# Patient Record
Sex: Female | Born: 1945 | Race: White | Hispanic: No | Marital: Married | Smoking: Never smoker
Health system: Southern US, Community
[De-identification: ages and names within clinical notes are randomized; demographics above are authoritative.]

## PROBLEM LIST (undated history)

## (undated) DIAGNOSIS — K222 Esophageal obstruction: Secondary | ICD-10-CM

## (undated) DIAGNOSIS — I471 Supraventricular tachycardia, unspecified: Secondary | ICD-10-CM

## (undated) DIAGNOSIS — Z8619 Personal history of other infectious and parasitic diseases: Secondary | ICD-10-CM

## (undated) DIAGNOSIS — I447 Left bundle-branch block, unspecified: Secondary | ICD-10-CM

## (undated) DIAGNOSIS — G43909 Migraine, unspecified, not intractable, without status migrainosus: Secondary | ICD-10-CM

## (undated) DIAGNOSIS — R000 Tachycardia, unspecified: Secondary | ICD-10-CM

## (undated) DIAGNOSIS — E785 Hyperlipidemia, unspecified: Secondary | ICD-10-CM

## (undated) HISTORY — DX: Esophageal obstruction: K22.2

## (undated) HISTORY — DX: Supraventricular tachycardia, unspecified: I47.10

## (undated) HISTORY — PX: MASTECTOMY: SHX3

## (undated) HISTORY — DX: Tachycardia, unspecified: R00.0

## (undated) HISTORY — DX: Left bundle-branch block, unspecified: I44.7

## (undated) HISTORY — DX: Hyperlipidemia, unspecified: E78.5

## (undated) HISTORY — DX: Migraine, unspecified, not intractable, without status migrainosus: G43.909

## (undated) HISTORY — DX: Supraventricular tachycardia: I47.1

## (undated) HISTORY — DX: Personal history of other infectious and parasitic diseases: Z86.19

---

## 1983-03-20 HISTORY — PX: TONSILLECTOMY: SUR1361

## 1991-03-30 HISTORY — PX: HYSTERECTOMY ABDOMINAL WITH SALPINGECTOMY: SHX6725

## 1997-03-19 HISTORY — PX: LUMBAR DISC SURGERY: SHX700

## 2012-08-05 LAB — HM PAP SMEAR: HM Pap smear: NEGATIVE

## 2013-03-19 HISTORY — PX: PLACEMENT OF BREAST IMPLANTS: SHX6334

## 2016-06-22 ENCOUNTER — Telehealth: Payer: Self-pay | Admitting: *Deleted

## 2016-06-22 NOTE — Telephone Encounter (Signed)
NOTES FAXED TO NL °

## 2016-06-26 ENCOUNTER — Telehealth: Payer: Self-pay | Admitting: Cardiovascular Disease

## 2016-06-26 NOTE — Telephone Encounter (Signed)
Received records from [facility] for appointment on 07/11/16 with Dr Oval Linsey.  Records put with Dr Blenda Mounts schedule for 07/11/16. lp

## 2016-07-11 ENCOUNTER — Ambulatory Visit (INDEPENDENT_AMBULATORY_CARE_PROVIDER_SITE_OTHER): Payer: Medicare Other | Admitting: Cardiovascular Disease

## 2016-07-11 ENCOUNTER — Encounter: Payer: Self-pay | Admitting: Cardiovascular Disease

## 2016-07-11 VITALS — BP 122/64 | HR 64 | Ht 66.5 in | Wt 120.0 lb

## 2016-07-11 DIAGNOSIS — I447 Left bundle-branch block, unspecified: Secondary | ICD-10-CM | POA: Diagnosis not present

## 2016-07-11 DIAGNOSIS — Z13220 Encounter for screening for lipoid disorders: Secondary | ICD-10-CM | POA: Diagnosis not present

## 2016-07-11 DIAGNOSIS — K222 Esophageal obstruction: Secondary | ICD-10-CM | POA: Diagnosis not present

## 2016-07-11 DIAGNOSIS — I471 Supraventricular tachycardia: Secondary | ICD-10-CM

## 2016-07-11 NOTE — Patient Instructions (Signed)
Medication Instructions:  Your physician recommends that you continue on your current medications as directed. Please refer to the Current Medication list given to you today.  Labwork: TSH/FT4/MAGNESIUM/CMET/CBC AT [facility]   Testing/Procedures: NONE  Follow-Up: Your physician wants you to follow-up in: 1 [place] will receive a reminder letter in the mail two months in advance. If you don't receive a letter, please call our office to schedule the follow-up appointment.  If you need a refill on your cardiac medications before your next appointment, please call your pharmacy.

## 2016-07-11 NOTE — Progress Notes (Signed)
Cardiology Office Note   Date:  07/15/2016   ID:  Kristin Hinton, DOB [birth year], MRN [id number]  PCP:  Pcp Not In System  Cardiologist:   Skeet Latch, MD   Chief Complaint  Patient presents with  . New Patient (Initial Visit)     History of Present Illness: Kristin Hinton is a 71 y.o. female with a rate related left bundle branch block, SVT, and Schatzki's right who presents to establish care. Kristin Hinton is transferring her medical care from [facility].  She was last seen 02/08/16.  She had and episode of lightheadedness and underwent a left heart catheterization 07/2015 that reportedly showed normal coronaries.  Her SVT was diagnosed in her 79s and has been managed with metoprolol.  Overall she has been doing well, but for the last few days she had a few episodes of palpitations.  Each episode lasts for less than one minute.  It typically occurs when she is laying in bed at night.  There is no associated shortness of breath and it goes away after coughing.  This felt different than her usual SVT episodes which typically leave her feeling very tired. Kristin Hinton denies lower extremity edema, orthopnea, or PND.  She likes to walk for exercise and uses the elliptical several times per week.  She also does yoga, Pilates, aand weights.  Sometimes on the elliptical she gets tired at first but it improves with time.  She denies chest pain or pressure.   Past Medical History:  Diagnosis Date  . LBBB (left bundle branch block)   . Schatzki's ring   . SVT (supraventricular tachycardia) ([place])   . Tachycardia     No past surgical history on file.   Current Outpatient Prescriptions  Medication Sig Dispense Refill  . dexlansoprazole (DEXILANT) 60 MG capsule Take 60 mg by mouth daily.    Marland Kitchen estradiol (CLIMARA - DOSED IN MG/24 HR) 0.075 mg/24hr patch Place 0.075 mg onto the skin once a week.    . fluticasone (FLONASE) 50 MCG/ACT nasal spray Place into both nostrils  daily.    . metoprolol succinate (TOPROL-XL) 25 MG 24 hr tablet Take 25 mg by mouth daily.    . ranitidine (ZANTAC) 75 MG tablet Take 75 mg by mouth 2 (two) times daily.     No current facility-administered medications for this visit.     Allergies:   Patient has no known allergies.    Social History:  The patient  reports that she has never smoked. She has quit using smokeless tobacco. She reports that she drinks alcohol.   Family History:  The patient's family history includes Breast cancer in her sister; Cancer in her brother and father; Congenital heart disease in her mother; Congestive Heart Failure in her mother; Stroke in her maternal grandmother.    ROS:  Please see the history of present illness.   Otherwise, review of systems are positive for none.   All other systems are reviewed and negative.    PHYSICAL EXAM: VS:  BP 122/64   Pulse 64   Ht 5' 6.5" (1.689 m)   Wt 54.4 kg (120 lb)   BMI 19.08 kg/m  , BMI Body mass index is 19.08 kg/m. GENERAL:  Well appearing HEENT:  Pupils equal round and reactive, fundi not visualized, oral mucosa unremarkable NECK:  No jugular venous distention, waveform within normal limits, carotid upstroke brisk and symmetric, no bruits, no thyromegaly LYMPHATICS:  No cervical adenopathy LUNGS:  Clear to auscultation  bilaterally HEART:  RRR.  PMI not displaced or sustained,S1 and S2 within normal limits, no S3, no S4, no clicks, no rubs, no murmurs ABD:  Flat, positive bowel sounds normal in frequency in pitch, no bruits, no rebound, no guarding, no midline pulsatile mass, no hepatomegaly, no splenomegaly EXT:  2 plus pulses throughout, no edema, no cyanosis no clubbing SKIN:  No rashes no nodules NEURO:  Cranial nerves II through XII grossly intact, motor grossly intact throughout PSYCH:  Cognitively intact, oriented to person place and time   EKG:  EKG is ordered today. The ekg ordered 07/11/16 demonstrates Sinus rhythm. Rate 64 bpm.  13  day Ziopatch 10/04/15:  Sinus rhythm. Rare PVCs and PACs. 113 runs of wide complex tachycardia suspicious for SVT with a rate related they aren't seen. Longest episode lasting X minutes 52 seconds at 130 bpm.  Echo 07/21/15: LVEF 55%. Mild MR/TR. RVSP 25  Cardiac CT-A2/9/17: Limited by artifact engaging Cardiac catheterization 08/01/15: Normal coronary arteries. LVEF 60%.  Recent Labs: 07/11/2016: ALT 16; BUN 14; Creat 0.77; Hemoglobin 12.6; Magnesium 2.1; Platelets 315; Potassium 5.4; Sodium 139; TSH 1.21   07/21/15: Sodium 138, potassium 4.6, BUN 12, creatinine 0.7 WBC 4.1, hemoglobin 13.0, hematocrit 38.5, platelets 28 Total cholesterol 212, trig lisuride 70, HDL 88, LDL 110   Lipid Panel    Component Value Date/Time   CHOL 208 (H) 07/11/2016 0815   TRIG 66 07/11/2016 0815   HDL 96 07/11/2016 0815   CHOLHDL 2.2 07/11/2016 0815   VLDL 13 07/11/2016 0815   LDLCALC 99 07/11/2016 0815      Wt Readings from Last 3 Encounters:  07/11/16 54.4 kg (120 lb)      ASSESSMENT AND PLAN:  # SVT: Well-controlled.  Her episodes of palpitations are shot lived and don't seem like her typical SVT.  She was advise that she can take extra metoprolol as needed for palpitations.  We will check a CBC, TSH, free T4, magnesium and CMP.  # CV Disease Prevention: Kristin Hinton was congratulated on her excellent exercise regimen.  Check lipids.    Current medicines are reviewed at length with the patient today.  The patient does not have concerns regarding medicines.  The following changes have been made:  no change  Labs/ tests ordered today include:   Orders Placed This Encounter  Procedures  . CBC with Differential/Platelet  . T4, free  . TSH  . Magnesium  . Comprehensive metabolic panel  . Lipid panel  . Lipid panel  . EKG 12-Lead     Disposition:   FU with     This note was written with the assistance of speech recognition software.  Please excuse any transcriptional  errors.  Signed, Kaleiyah Polsky C. Oval Linsey, MD, [facility]  07/15/2016 10:00 PM    [place]

## 2016-07-12 DIAGNOSIS — I471 Supraventricular tachycardia: Secondary | ICD-10-CM | POA: Diagnosis not present

## 2016-07-12 DIAGNOSIS — Z13220 Encounter for screening for lipoid disorders: Secondary | ICD-10-CM | POA: Diagnosis not present

## 2016-07-12 DIAGNOSIS — Z79899 Other long term (current) drug therapy: Secondary | ICD-10-CM | POA: Diagnosis not present

## 2016-07-12 LAB — CBC WITH DIFFERENTIAL/PLATELET
BASOS ABS: 94 {cells}/uL (ref 0–200)
Basophils Relative: 2 %
EOS ABS: 47 {cells}/uL (ref 15–500)
EOS PCT: 1 %
HCT: 38.5 % (ref 35.0–45.0)
HEMOGLOBIN: 12.6 g/dL (ref 11.7–15.5)
LYMPHS ABS: 1081 {cells}/uL (ref 850–3900)
Lymphocytes Relative: 23 %
MCH: 30.9 pg (ref 27.0–33.0)
MCHC: 32.7 g/dL (ref 32.0–36.0)
MCV: 94.4 fL (ref 80.0–100.0)
MPV: 9.3 fL (ref 7.5–12.5)
Monocytes Absolute: 470 cells/uL (ref 200–950)
Monocytes Relative: 10 %
NEUTROS PCT: 64 %
Neutro Abs: 3008 cells/uL (ref 1500–7800)
Platelets: 315 10*3/uL (ref 140–400)
RBC: 4.08 MIL/uL (ref 3.80–5.10)
RDW: 13.9 % (ref 11.0–15.0)
WBC: 4.7 10*3/uL (ref 3.8–10.8)

## 2016-07-12 LAB — MAGNESIUM: Magnesium: 2.1 mg/dL (ref 1.5–2.5)

## 2016-07-12 LAB — COMPREHENSIVE METABOLIC PANEL
ALK PHOS: 46 U/L (ref 33–130)
ALT: 16 U/L (ref 6–29)
AST: 21 U/L (ref 10–35)
Albumin: 4.4 g/dL (ref 3.6–5.1)
BILIRUBIN TOTAL: 0.4 mg/dL (ref 0.2–1.2)
BUN: 14 mg/dL (ref 7–25)
CALCIUM: 9.1 mg/dL (ref 8.6–10.4)
CO2: 25 mmol/L (ref 20–31)
CREATININE: 0.77 mg/dL (ref 0.60–0.93)
Chloride: 104 mmol/L (ref 98–110)
Glucose, Bld: 91 mg/dL (ref 65–99)
Potassium: 5.4 mmol/L — ABNORMAL HIGH (ref 3.5–5.3)
SODIUM: 139 mmol/L (ref 135–146)
Total Protein: 6.4 g/dL (ref 6.1–8.1)

## 2016-07-12 LAB — LIPID PANEL
CHOL/HDL RATIO: 2.2 ratio (ref <5.0)
Cholesterol: 208 mg/dL — ABNORMAL HIGH (ref <200)
HDL: 96 mg/dL (ref >50)
LDL Cholesterol: 99 mg/dL (ref <100)
Triglycerides: 66 mg/dL (ref <150)
VLDL: 13 mg/dL (ref <30)

## 2016-07-12 LAB — TSH: TSH: 1.21 mIU/L

## 2016-07-12 LAB — T4, FREE: Free T4: 1.2 ng/dL (ref 0.8–1.8)

## 2016-07-15 DIAGNOSIS — I471 Supraventricular tachycardia: Secondary | ICD-10-CM | POA: Insufficient documentation

## 2016-07-15 DIAGNOSIS — I447 Left bundle-branch block, unspecified: Secondary | ICD-10-CM | POA: Insufficient documentation

## 2016-07-15 DIAGNOSIS — K222 Esophageal obstruction: Secondary | ICD-10-CM | POA: Insufficient documentation

## 2016-07-25 ENCOUNTER — Telehealth: Payer: Self-pay | Admitting: Cardiovascular Disease

## 2016-07-25 NOTE — Telephone Encounter (Signed)
Spoke with the patient and gave her the lab results plus advice from Dr. Oval Linsey: Normal labs. Cholesterol is a little high. Work on limiting fried foods, fatty foods and cheese. Continue exercise. The patient verbalized her understanding.

## 2016-07-25 NOTE — Telephone Encounter (Signed)
Pt had labs done on 4.25.18 and want to know results. Please call pt

## 2016-07-30 DIAGNOSIS — L259 Unspecified contact dermatitis, unspecified cause: Secondary | ICD-10-CM | POA: Diagnosis not present

## 2016-10-29 DIAGNOSIS — R21 Rash and other nonspecific skin eruption: Secondary | ICD-10-CM | POA: Diagnosis not present

## 2016-10-29 DIAGNOSIS — Z9109 Other allergy status, other than to drugs and biological substances: Secondary | ICD-10-CM | POA: Diagnosis not present

## 2016-10-29 DIAGNOSIS — K219 Gastro-esophageal reflux disease without esophagitis: Secondary | ICD-10-CM | POA: Diagnosis not present

## 2016-10-29 DIAGNOSIS — Z9889 Other specified postprocedural states: Secondary | ICD-10-CM | POA: Diagnosis not present

## 2016-10-29 DIAGNOSIS — Z1231 Encounter for screening mammogram for malignant neoplasm of breast: Secondary | ICD-10-CM | POA: Diagnosis not present

## 2016-10-29 DIAGNOSIS — N951 Menopausal and female climacteric states: Secondary | ICD-10-CM | POA: Diagnosis not present

## 2016-10-30 ENCOUNTER — Other Ambulatory Visit: Payer: Self-pay

## 2016-10-30 MED ORDER — METOPROLOL SUCCINATE ER 25 MG PO TB24: 25.0000 mg | ORAL_TABLET | Freq: Every day | ORAL | 1 refills | Status: DC

## 2016-11-05 DIAGNOSIS — Z1231 Encounter for screening mammogram for malignant neoplasm of breast: Secondary | ICD-10-CM | POA: Diagnosis not present

## 2016-11-05 LAB — HM MAMMOGRAPHY

## 2016-11-06 DIAGNOSIS — L719 Rosacea, unspecified: Secondary | ICD-10-CM | POA: Diagnosis not present

## 2016-11-08 DIAGNOSIS — R922 Inconclusive mammogram: Secondary | ICD-10-CM | POA: Diagnosis not present

## 2016-11-29 DIAGNOSIS — L718 Other rosacea: Secondary | ICD-10-CM | POA: Diagnosis not present

## 2016-12-31 DIAGNOSIS — I8312 Varicose veins of left lower extremity with inflammation: Secondary | ICD-10-CM | POA: Diagnosis not present

## 2016-12-31 DIAGNOSIS — I8311 Varicose veins of right lower extremity with inflammation: Secondary | ICD-10-CM | POA: Diagnosis not present

## 2017-01-01 DIAGNOSIS — I781 Nevus, non-neoplastic: Secondary | ICD-10-CM | POA: Diagnosis not present

## 2017-01-11 DIAGNOSIS — I8312 Varicose veins of left lower extremity with inflammation: Secondary | ICD-10-CM | POA: Diagnosis not present

## 2017-01-11 DIAGNOSIS — I8311 Varicose veins of right lower extremity with inflammation: Secondary | ICD-10-CM | POA: Diagnosis not present

## 2017-01-15 DIAGNOSIS — I83813 Varicose veins of bilateral lower extremities with pain: Secondary | ICD-10-CM | POA: Diagnosis not present

## 2017-04-02 ENCOUNTER — Telehealth: Payer: Self-pay | Admitting: Cardiovascular Disease

## 2017-04-02 NOTE — Telephone Encounter (Signed)
spoke to patient - per protocol patient does not need  Pre -med - for diagnosis of SVT Patient is having a crown placed tomorrow 1 pm. Patient aware will defer to Dr Oval Linsey ans will contact her back

## 2017-04-02 NOTE — Telephone Encounter (Signed)
Pt has SVT,she has a dentist appointment tomrrow.Does she need to be pre-medicated?

## 2017-04-02 NOTE — Telephone Encounter (Signed)
Discussed with Dr Oval Linsey and patient does not need SBE, advised patient

## 2017-04-30 DIAGNOSIS — N951 Menopausal and female climacteric states: Secondary | ICD-10-CM | POA: Diagnosis not present

## 2017-04-30 DIAGNOSIS — Z0000 Encounter for general adult medical examination without abnormal findings: Secondary | ICD-10-CM | POA: Diagnosis not present

## 2017-04-30 DIAGNOSIS — I700 Atherosclerosis of aorta: Secondary | ICD-10-CM | POA: Diagnosis not present

## 2017-04-30 LAB — LIPID PANEL
Cholesterol: 228 — AB (ref 0–200)
HDL: 93 — AB (ref 35–70)
LDL CALC: 120
LDL/HDL RATIO: 2.5
Triglycerides: 76 (ref 40–160)

## 2017-04-30 LAB — VITAMIN D 25 HYDROXY (VIT D DEFICIENCY, FRACTURES): Vit D, 25-Hydroxy: 30

## 2017-04-30 LAB — CBC AND DIFFERENTIAL
HEMATOCRIT: 37 (ref 36–46)
HEMOGLOBIN: 12.9 (ref 12.0–16.0)
Neutrophils Absolute: 4
Platelets: 320 (ref 150–399)
WBC: 6.5

## 2017-04-30 LAB — BASIC METABOLIC PANEL
BUN: 12 (ref 4–21)
CREATININE: 0.8 (ref 0.5–1.1)
Glucose: 100
Potassium: 4.7 (ref 3.4–5.3)
SODIUM: 136 — AB (ref 137–147)

## 2017-04-30 LAB — HEPATIC FUNCTION PANEL
ALK PHOS: 48 (ref 25–125)
ALT: 19 (ref 7–35)
AST: 28 (ref 13–35)
Bilirubin, Total: 0.6

## 2017-06-19 DIAGNOSIS — H2513 Age-related nuclear cataract, bilateral: Secondary | ICD-10-CM | POA: Diagnosis not present

## 2017-06-27 DIAGNOSIS — Z800 Family history of malignant neoplasm of digestive organs: Secondary | ICD-10-CM | POA: Diagnosis not present

## 2017-06-27 DIAGNOSIS — Z809 Family history of malignant neoplasm, unspecified: Secondary | ICD-10-CM | POA: Diagnosis not present

## 2017-07-14 NOTE — Progress Notes (Signed)
Cardiology Office Note   Date:  07/15/2017   ID:  Kristin Hinton, DOB [birth year], MRN [id number]  PCP:  Kristin Passy, MD  Cardiologist:   Kristin Latch, MD   No chief complaint on file.    History of Present Illness: Kristin Hinton is a 72 y.o. female with a rate related left bundle branch block, SVT, and Schatzki's ring here for follow up.  She was initially seen 06/2016 to establish care after being seen by [facility].  She had and episode of lightheadedness and underwent a left heart catheterization 07/2015 that reportedly showed normal coronaries.  Her SVT was diagnosed in her 27s and has been managed with metoprolol.  Since her last appointment she had been doing well.  However lately she notes more frequent palpitations.  It occurs later in the evening she takes an extra dose of metoprolol.  She tries not to take more doses in the mornings as it makes her feel fatigued.  The episodes are occurring multiple times per week and last up to a minute or 2 at a time.  It typically improves with cough or extra metoprolol.  She continues to exercise regularly.  She goes to the gym daily and does elliptical, yoga, weights, or Pilates.  She has no exertional chest pain or shortness of breath.  She has not experienced any lower extremity edema, orthopnea, or PND.  She continues to work on her diet.    Past Medical History:  Diagnosis Date  . LBBB (left bundle branch block)   . Schatzki's ring   . SVT (supraventricular tachycardia) ([place])   . Tachycardia     History reviewed. No pertinent surgical history.   Current Outpatient Medications  Medication Sig Dispense Refill  . dexlansoprazole (DEXILANT) 60 MG capsule Take 60 mg by mouth daily.    Marland Kitchen estradiol (CLIMARA - DOSED IN MG/24 HR) 0.075 mg/24hr patch Place 0.075 mg onto the skin once a week.    . fluticasone (FLONASE) 50 MCG/ACT nasal spray Place into both nostrils daily.    . metoprolol succinate (TOPROL-XL) 25  MG 24 hr tablet Take 25 mg by mouth as directed. TAKE 2 TABLETS IN THE EVENING    . ranitidine (ZANTAC) 75 MG tablet Take 75 mg by mouth 2 (two) times daily.     No current facility-administered medications for this visit.     Allergies:   Molds & smuts and Pollen extract    Social History:  The patient  reports that she has never smoked. She has quit using smokeless tobacco. She reports that she drinks alcohol.   Family History:  The patient's family history includes Breast cancer in her sister; Cancer in her brother and father; Congenital heart disease in her mother; Congestive Heart Failure in her mother; Stroke in her maternal grandmother.    ROS:  Please see the history of present illness.   Otherwise, review of systems are positive for none.   All other systems are reviewed and negative.    PHYSICAL EXAM: VS:  BP 131/68   Pulse 60   Ht 5\' 6"  (1.676 m)   Wt 117 lb 9.6 oz (53.3 kg)   BMI 18.98 kg/m  , BMI Body mass index is 18.98 kg/m. GENERAL:  Well appearing HEENT:  Pupils equal round and reactive, fundi not visualized, oral mucosa unremarkable NECK:  No jugular venous distention, waveform within normal limits, carotid upstroke brisk and symmetric, no bruits, no thyromegaly LYMPHATICS:  No cervical adenopathy  LUNGS:  Clear to auscultation bilaterally HEART:  RRR.  PMI not displaced or sustained,S1 and S2 within normal limits, no S3, no S4, no clicks, no rubs, no murmurs ABD:  Flat, positive bowel sounds normal in frequency in pitch, no bruits, no rebound, no guarding, no midline pulsatile mass, no hepatomegaly, no splenomegaly EXT:  2 plus pulses throughout, no edema, no cyanosis no clubbing SKIN:  No rashes no nodules NEURO:  Cranial nerves II through XII grossly intact, motor grossly intact throughout PSYCH:  Cognitively intact, oriented to person place and time   EKG:  EKG is ordered today. The ekg ordered 07/11/16 demonstrates Sinus rhythm. Rate 64 bpm. 07/15/17: Sinus  rhythm.   Rate 60 pm.  Non-specific ST changes.  She was  13 day Ziopatch 10/04/15:  Sinus rhythm. Rare PVCs and PACs. 113 runs of wide complex tachycardia suspicious for SVT with a rate related they aren't seen. Longest episode lasting X minutes 52 seconds at 130 bpm.  Echo 07/21/15: LVEF 55%. Mild MR/TR. RVSP 25  Cardiac CT-A2/9/17: Limited by artifact engaging Cardiac catheterization 08/01/15: Normal coronary arteries. LVEF 60%.  Recent Labs: No results found for requested labs within last 8760 hours.   07/21/15: Sodium 138, potassium 4.6, BUN 12, creatinine 0.7 WBC 4.1, hemoglobin 13.0, hematocrit 38.5, platelets 28 Total cholesterol 212, trig lisuride 70, HDL 88, LDL 110   Lipid Panel    Component Value Date/Time   CHOL 208 (H) 07/11/2016 0815   TRIG 66 07/11/2016 0815   HDL 96 07/11/2016 0815   CHOLHDL 2.2 07/11/2016 0815   VLDL 13 07/11/2016 0815   LDLCALC 99 07/11/2016 0815      Wt Readings from Last 3 Encounters:  07/15/17 117 lb 9.6 oz (53.3 kg)  07/11/16 120 lb (54.4 kg)      ASSESSMENT AND PLAN:  # SVT: More frequent episodes.  Increase metoprolol to 50mg  qhs.  Check CMP, CBC, magnesium, and TSH.   # CV Disease Prevention: [name] was congratulated on her excellent exercise regimen.  Check lipids and CMP.     Current medicines are reviewed at length with the patient today.  The patient does not have concerns regarding medicines.  The following changes have been made:  Increase metoprolol to 50mg   Labs/ tests ordered today include:   Orders Placed This Encounter  Procedures  . Lipid panel  . TSH  . EKG 12-Lead     Disposition:   FU with Kristin Shortridge C. Oval Linsey, MD, [facility] in 1-2 months.    Signed, Kristin Makris C. Oval Linsey, MD, [facility]  07/15/2017 11:27 AM    [place]

## 2017-07-15 ENCOUNTER — Encounter: Payer: Self-pay | Admitting: Cardiovascular Disease

## 2017-07-15 ENCOUNTER — Ambulatory Visit (INDEPENDENT_AMBULATORY_CARE_PROVIDER_SITE_OTHER): Payer: Medicare Other | Admitting: Cardiovascular Disease

## 2017-07-15 VITALS — BP 131/68 | HR 60 | Ht 66.0 in | Wt 117.6 lb

## 2017-07-15 DIAGNOSIS — I471 Supraventricular tachycardia: Secondary | ICD-10-CM

## 2017-07-15 NOTE — Patient Instructions (Addendum)
Medication Instructions:  INCREASE YOUR TO METOPROLOL 50 MG IN THE EVENING  Labwork: TSH/MAGNESIUM/CBC/CMET TODAY   Testing/Procedures: NONE  Follow-Up: Your physician recommends that you schedule a follow-up appointment in: 1-2 MONTHS   If you need a refill on your cardiac medications before your next appointment, please call your pharmacy.

## 2017-07-16 ENCOUNTER — Ambulatory Visit (INDEPENDENT_AMBULATORY_CARE_PROVIDER_SITE_OTHER): Payer: Medicare Other | Admitting: Family Medicine

## 2017-07-16 ENCOUNTER — Encounter: Payer: Self-pay | Admitting: Family Medicine

## 2017-07-16 VITALS — BP 116/60 | HR 60 | Temp 97.8°F | Ht 67.0 in | Wt 117.8 lb

## 2017-07-16 DIAGNOSIS — I447 Left bundle-branch block, unspecified: Secondary | ICD-10-CM

## 2017-07-16 DIAGNOSIS — K222 Esophageal obstruction: Secondary | ICD-10-CM

## 2017-07-16 DIAGNOSIS — I471 Supraventricular tachycardia: Secondary | ICD-10-CM | POA: Diagnosis not present

## 2017-07-16 DIAGNOSIS — M62838 Other muscle spasm: Secondary | ICD-10-CM | POA: Diagnosis not present

## 2017-07-16 LAB — CBC WITH DIFFERENTIAL/PLATELET
BASOS ABS: 0.1 10*3/uL (ref 0.0–0.2)
BASOS: 1 %
EOS (ABSOLUTE): 0.1 10*3/uL (ref 0.0–0.4)
Eos: 1 %
HEMOGLOBIN: 12.5 g/dL (ref 11.1–15.9)
Hematocrit: 38.1 % (ref 34.0–46.6)
IMMATURE GRANS (ABS): 0 10*3/uL (ref 0.0–0.1)
IMMATURE GRANULOCYTES: 0 %
LYMPHS: 27 %
Lymphocytes Absolute: 1.3 10*3/uL (ref 0.7–3.1)
MCH: 31.2 pg (ref 26.6–33.0)
MCHC: 32.8 g/dL (ref 31.5–35.7)
MCV: 95 fL (ref 79–97)
MONOCYTES: 11 %
Monocytes Absolute: 0.5 10*3/uL (ref 0.1–0.9)
NEUTROS ABS: 2.8 10*3/uL (ref 1.4–7.0)
NEUTROS PCT: 60 %
PLATELETS: 311 10*3/uL (ref 150–379)
RBC: 4.01 x10E6/uL (ref 3.77–5.28)
RDW: 13.9 % (ref 12.3–15.4)
WBC: 4.7 10*3/uL (ref 3.4–10.8)

## 2017-07-16 LAB — COMPREHENSIVE METABOLIC PANEL
ALT: 19 IU/L (ref 0–32)
AST: 26 IU/L (ref 0–40)
Albumin/Globulin Ratio: 2 (ref 1.2–2.2)
Albumin: 4.5 g/dL (ref 3.5–4.8)
Alkaline Phosphatase: 54 IU/L (ref 39–117)
BUN/Creatinine Ratio: 8 — ABNORMAL LOW (ref 12–28)
BUN: 7 mg/dL — AB (ref 8–27)
Bilirubin Total: 0.3 mg/dL (ref 0.0–1.2)
CALCIUM: 9.5 mg/dL (ref 8.7–10.3)
CO2: 23 mmol/L (ref 20–29)
Chloride: 99 mmol/L (ref 96–106)
Creatinine, Ser: 0.84 mg/dL (ref 0.57–1.00)
GFR, EST AFRICAN AMERICAN: 81 mL/min/{1.73_m2} (ref >59)
GFR, EST NON AFRICAN AMERICAN: 70 mL/min/{1.73_m2} (ref >59)
Globulin, Total: 2.3 g/dL (ref 1.5–4.5)
Glucose: 78 mg/dL (ref 65–99)
Potassium: 4.9 mmol/L (ref 3.5–5.2)
Sodium: 138 mmol/L (ref 134–144)
TOTAL PROTEIN: 6.8 g/dL (ref 6.0–8.5)

## 2017-07-16 LAB — TSH: TSH: 0.845 u[IU]/mL (ref 0.450–4.500)

## 2017-07-16 LAB — MAGNESIUM: Magnesium: 2.2 mg/dL (ref 1.6–2.3)

## 2017-07-16 NOTE — Assessment & Plan Note (Signed)
Advised supportive care with heating pad, stretches. No red flag symptoms in history or on exam. Call or return to clinic prn if these symptoms worsen or fail to improve as anticipated. The patient indicates understanding of these issues and agrees with the plan.

## 2017-07-16 NOTE — Assessment & Plan Note (Signed)
Followed by cardiology. Beta blocker dosage recently increased. Asymptomatic currently.  No changes made.  She will keep follow up appointment with cardiology in June. The patient indicates understanding of these issues and agrees with the plan.

## 2017-07-16 NOTE — Progress Notes (Signed)
Subjective:   Patient ID: Kristin Hinton, female    DOB: 08/11/1945, 72 y.o.   MRN: [id number]  Kristin Hinton is a pleasant 72 y.o. year old female who presents to clinic today with New Patient (Initial Visit) (Patient is here today to establish care.  She had a Mammogram on 8.20.18.  She had a Colonoscopy a year ago in [place]  as well as an Endoscopy with esophageal stretching. States that she had BMD) and Neck Pain (She states that she has had a crick in her neck for about a week.  She has changed pillows, done stretches.  When she turns her head to the left she has pain in her left ear, left upper jaw, left temple pain as well.  She hurt so bad at 3am that she had to use her hand to lift her head and has been unable to sleep due to the pain.)  on 07/16/2017  HPI:  Neck pain- left sided neck stiffness and pain with movement that moves to her left ear and upper jaw intermittently for the past week. No left UE radiculopathy or weakness. Has tried changing pillows, stretching, yoga.  GERD- has been well controlled with Dexilant. H/o Schatzki's ring- s/p esophageal dilatation a couple of years ago.  Symptoms have been well controlled.  SVT- followed by cardiology, Dr. Oval Linsey.  Saw her yesterday.  Note reviewed- increased episodes so she has increased her metoprolol to 50 mg qhs. Also checked mag,lipid panel, CMET, and TSH- results have not returned yet.  Has follow up on June 17th.      Current Outpatient Medications on File Prior to Visit  Medication Sig Dispense Refill  . dexlansoprazole (DEXILANT) 60 MG capsule Take 60 mg by mouth daily.    Marland Kitchen estradiol (CLIMARA - DOSED IN MG/24 HR) 0.075 mg/24hr patch Place 0.075 mg onto the skin once a week.    . fluticasone (FLONASE) 50 MCG/ACT nasal spray Place into both nostrils daily.    . metoprolol succinate (TOPROL-XL) 25 MG 24 hr tablet Take 25 mg by mouth as directed. TAKE 2 TABLETS IN THE EVENING    . ranitidine (ZANTAC) 75 MG tablet Take 75  mg by mouth 2 (two) times daily.     No current facility-administered medications on file prior to visit.     Allergies  Allergen Reactions  . Molds & Smuts Itching  . Pollen Extract Itching    Past Medical History:  Diagnosis Date  . LBBB (left bundle branch block)   . Schatzki's ring   . SVT (supraventricular tachycardia) ([place])   . Tachycardia     No past surgical history on file.  Family History  Problem Relation Age of Onset  . Congenital heart disease Mother   . Congestive Heart Failure Mother   . Cancer Father   . Breast cancer Sister   . Cancer Brother   . Stroke Maternal Grandmother     Social History   Socioeconomic History  . Marital status: Married    Spouse name: Not on file  . Number of children: Not on file  . Years of education: Not on file  . Highest education level: Not on file  Occupational History  . Not on file  Social Needs  . Financial resource strain: Not on file  . Food insecurity:    Worry: Not on file    Inability: Not on file  . Transportation needs:    Medical: Not on file    Non-medical: Not on  file  Tobacco Use  . Smoking status: Never Smoker  . Smokeless tobacco: Former Network engineer and Sexual Activity  . Alcohol use: Yes    Comment: 7-10 drinks per week  . Drug use: Not on file  . Sexual activity: Not on file  Lifestyle  . Physical activity:    Days per week: Not on file    Minutes per session: Not on file  . Stress: Not on file  Relationships  . Social connections:    Talks on phone: Not on file    Gets together: Not on file    Attends religious service: Not on file    Active member of club or organization: Not on file    Attends meetings of clubs or organizations: Not on file    Relationship status: Not on file  . Intimate partner violence:    Fear of current or ex partner: Not on file    Emotionally abused: Not on file    Physically abused: Not on file    Forced sexual activity: Not on file  Other Topics  Concern  . Not on file  Social History Narrative  . Not on file   The PMH, PSH, Social History, Family History, Medications, and allergies have been reviewed in [facility], and have been updated if relevant.   Review of Systems  Constitutional: Negative.   HENT: Negative.   Eyes: Negative.   Respiratory: Negative.   Cardiovascular: Negative.   Gastrointestinal: Negative.   Endocrine: Negative.   Genitourinary: Negative.   Musculoskeletal: Positive for neck pain and neck stiffness. Negative for arthralgias, back pain, gait problem, joint swelling and myalgias.  Skin: Negative.   Allergic/Immunologic: Negative.   Neurological: Negative.   Hematological: Negative.   Psychiatric/Behavioral: Negative.   All other systems reviewed and are negative.      Objective:    BP 116/60 (BP Location: Left Arm, Patient Position: Sitting, Cuff Size: Normal)   Pulse 60   Temp 97.8 F (36.6 C) (Oral)   Ht 5\' 7"  (1.702 m)   Wt 117 lb 12.8 oz (53.4 kg)   SpO2 97%   BMI 18.45 kg/m    Physical Exam  Constitutional: She is oriented to person, place, and time. She appears well-developed and well-nourished. No distress.  HENT:  Head: Normocephalic and atraumatic.  Eyes: EOM are normal.  Cardiovascular: Normal rate.  Pulmonary/Chest: Effort normal.  Musculoskeletal:       Cervical back: She exhibits decreased range of motion and spasm. She exhibits no tenderness, no bony tenderness, no swelling, no edema, no deformity, no laceration, no pain and normal pulse.  Neurological: She is alert and oriented to person, place, and time. No cranial nerve deficit. Coordination normal.  Skin: Skin is warm and dry. She is not diaphoretic.  Psychiatric: She has a normal mood and affect. Her behavior is normal. Judgment and thought content normal.  Nursing note and vitals reviewed.         Assessment & Plan:   LBBB (left bundle branch block)  Schatzki's ring  SVT (supraventricular tachycardia)  (HCC) No follow-ups on file.

## 2017-07-16 NOTE — Patient Instructions (Signed)
Great to meet you.  Try a heating a pad on your neck and keep me updated.

## 2017-07-16 NOTE — Assessment & Plan Note (Signed)
Continue PPI. Remote h/o esophageal dilatation.

## 2017-07-17 ENCOUNTER — Encounter: Payer: Self-pay | Admitting: Family Medicine

## 2017-07-30 ENCOUNTER — Ambulatory Visit (INDEPENDENT_AMBULATORY_CARE_PROVIDER_SITE_OTHER): Payer: Medicare Other | Admitting: Family Medicine

## 2017-07-30 ENCOUNTER — Encounter: Payer: Self-pay | Admitting: Family Medicine

## 2017-07-30 ENCOUNTER — Encounter: Payer: Medicare Other | Admitting: Vascular Surgery

## 2017-07-30 ENCOUNTER — Encounter

## 2017-07-30 VITALS — BP 112/74 | HR 67 | Temp 97.8°F | Ht 67.0 in | Wt 116.6 lb

## 2017-07-30 DIAGNOSIS — M62830 Muscle spasm of back: Secondary | ICD-10-CM | POA: Diagnosis not present

## 2017-07-30 NOTE — Patient Instructions (Signed)
Great to see you.  Have a wonderful trip!  Keep using your heating pad and taking Alleve as needed with food.

## 2017-07-30 NOTE — Progress Notes (Signed)
SUBJECTIVE:  Kristin Hinton is a 72 y.o. female who complains of low back pain for 3 day(s), positional with bending or lifting, without radiation down the legs. Precipitating factors: none recalled by the patient. Prior history of back problems: previous spinal surgery - 2000. There is no numbness in the legs.  Has been using a heating pad and taking Alleve.  Current Outpatient Medications on File Prior to Visit  Medication Sig Dispense Refill  . dexlansoprazole (DEXILANT) 60 MG capsule Take 60 mg by mouth daily.    Marland Kitchen estradiol (CLIMARA - DOSED IN MG/24 HR) 0.075 mg/24hr patch Place 0.075 mg onto the skin once a week.    . fluticasone (FLONASE) 50 MCG/ACT nasal spray Place into both nostrils daily.    . metoprolol succinate (TOPROL-XL) 25 MG 24 hr tablet Take 25 mg by mouth as directed. TAKE 2 TABLETS IN THE EVENING    . ranitidine (ZANTAC) 75 MG tablet Take 75 mg by mouth 2 (two) times daily.     No current facility-administered medications on file prior to visit.     Allergies  Allergen Reactions  . Molds & Smuts Itching  . Pollen Extract Itching    Past Medical History:  Diagnosis Date  . History of chicken pox   . Hyperlipidemia   . LBBB (left bundle branch block)   . Migraines   . Schatzki's ring   . SVT (supraventricular tachycardia) ([place])   . Tachycardia     Past Surgical History:  Procedure Laterality Date  . HYSTERECTOMY ABDOMINAL WITH SALPINGECTOMY  03/30/1991  . [place] SURGERY  03/19/1997  . MASTECTOMY Left    Due to surgical complications related to previous breast implant  . PLACEMENT OF BREAST IMPLANTS  03/19/2013  . TONSILLECTOMY  03/20/1983    Family History  Problem Relation Age of Onset  . Congenital heart disease Mother   . Congestive Heart Failure Mother   . Hyperlipidemia Mother   . Hypertension Mother   . Kidney disease Mother   . Arthritis Mother   . Hearing loss Mother   . Cancer Father        Lung  . Breast cancer Sister   . Cancer  Brother   . Alcohol abuse Brother   . Drug abuse Brother   . Early death Brother   . Heart attack Brother   . Stroke Maternal Grandmother   . Arthritis Maternal Grandmother   . Hypercholesterolemia Maternal Grandmother   . Hypertension Maternal Grandmother   . Arthritis Paternal Grandmother   . Heart attack Paternal Grandmother   . Hypercholesterolemia Paternal Grandmother   . Hypertension Paternal Grandmother   . Alcohol abuse Brother   . Early death Brother   . Hypercholesterolemia Son   . Hypertension Son     Social History   Socioeconomic History  . Marital status: Married    Spouse name: Not on file  . Number of children: Not on file  . Years of education: Not on file  . Highest education level: Not on file  Occupational History  . Not on file  Social Needs  . Financial resource strain: Not on file  . Food insecurity:    Worry: Not on file    Inability: Not on file  . Transportation needs:    Medical: Not on file    Non-medical: Not on file  Tobacco Use  . Smoking status: Never Smoker  . Smokeless tobacco: Former Network engineer and Sexual Activity  . Alcohol use:  Yes    Comment: 7-10 drinks per week  . Drug use: Not on file  . Sexual activity: Not on file  Lifestyle  . Physical activity:    Days per week: Not on file    Minutes per session: Not on file  . Stress: Not on file  Relationships  . Social connections:    Talks on phone: Not on file    Gets together: Not on file    Attends religious service: Not on file    Active member of club or organization: Not on file    Attends meetings of clubs or organizations: Not on file    Relationship status: Not on file  . Intimate partner violence:    Fear of current or ex partner: Not on file    Emotionally abused: Not on file    Physically abused: Not on file    Forced sexual activity: Not on file  Other Topics Concern  . Not on file  Social History Narrative  . Not on file   The PMH, PSH, Social  History, Family History, Medications, and allergies have been reviewed in [facility], and have been updated if relevant.  OBJECTIVE: BP 112/74 (BP Location: Left Arm, Patient Position: Sitting, Cuff Size: Normal)   Pulse 67   Temp 97.8 F (36.6 C) (Oral)   Ht 5\' 7"  (1.702 m)   Wt 116 lb 9.6 oz (52.9 kg)   SpO2 99%   BMI 18.26 kg/m   Patient appears to be in mild to moderate pain, antalgic gait noted. Lumbosacral spine area reveals no local tenderness or mass.  Painful and reduced LS ROM noted. Straight leg raise is negative at 90 degrees on bilateral. DTR's, motor strength and sensation normal, including heel and toe gait.  Peripheral pulses are palpable. X-Ray: not indicated.  ASSESSMENT:  lumbar strain  PLAN: For acute pain, rest, intermittent application of heat (do not sleep on heating pad), analgesics and muscle relaxants are recommended. Discussed longer term treatment plan of prn NSAID's and discussed a home back care exercise program with flexion exercise routine. Proper lifting with avoidance of heavy lifting discussed. Consider Physical Therapy and XRay studies if not improving. Call or return to clinic prn if these symptoms worsen or fail to improve as anticipated.

## 2017-08-13 ENCOUNTER — Other Ambulatory Visit: Payer: Self-pay

## 2017-08-13 MED ORDER — ESTRADIOL 0.075 MG/24HR TD PTWK: 0.0750 mg | MEDICATED_PATCH | TRANSDERMAL | 3 refills | Status: DC

## 2017-08-13 MED ORDER — DEXLANSOPRAZOLE 60 MG PO CPDR: 60.0000 mg | DELAYED_RELEASE_CAPSULE | Freq: Every day | ORAL | 3 refills | Status: DC

## 2017-08-13 MED ORDER — FLUTICASONE PROPIONATE 50 MCG/ACT NA SUSP: 2.0000 | Freq: Every day | NASAL | 3 refills | Status: DC

## 2017-08-21 ENCOUNTER — Other Ambulatory Visit: Payer: Self-pay

## 2017-08-21 MED ORDER — ESTRADIOL 0.075 MG/24HR TD PTWK: 0.0750 mg | MEDICATED_PATCH | TRANSDERMAL | 3 refills | Status: DC

## 2017-08-21 NOTE — Progress Notes (Signed)
Pt takes patches biweekly/corrected and sent to [facility]/thx dmf

## 2017-09-02 ENCOUNTER — Encounter: Payer: Self-pay | Admitting: Cardiovascular Disease

## 2017-09-02 ENCOUNTER — Ambulatory Visit (INDEPENDENT_AMBULATORY_CARE_PROVIDER_SITE_OTHER): Payer: Medicare Other | Admitting: Cardiovascular Disease

## 2017-09-02 VITALS — BP 121/57 | HR 61 | Ht 66.0 in | Wt 113.8 lb

## 2017-09-02 DIAGNOSIS — I471 Supraventricular tachycardia: Secondary | ICD-10-CM

## 2017-09-02 MED ORDER — DILTIAZEM HCL ER COATED BEADS 120 MG PO CP24: 120.0000 mg | ORAL_CAPSULE | Freq: Every day | ORAL | 3 refills | Status: DC

## 2017-09-02 NOTE — Progress Notes (Signed)
Cardiology Office Note   Date:  09/02/2017   ID:  Kristin Hinton, DOB 09-Jan-1946, MRN [id number]  PCP:  Lucille Passy, MD  Cardiologist:   Skeet Latch, MD   Chief Complaint  Patient presents with  . Follow-up     History of Present Illness: Kristin Hinton is a 72 y.o. female with a rate related left bundle branch block, SVT, and Schatzki's ring here for follow up.  She was initially seen 06/2016 to establish care after being seen by [facility] Cardiology Specialist.  She had and episode of lightheadedness and underwent a left heart catheterization 07/2015 that reportedly showed normal coronaries.  Her SVT was diagnosed in her 43s and has been managed with metoprolol.  At her last appointment she noted increased palpitations.  Metoprolol was increased to 50mg  qhs.  Since that time she has not noted any improvement in her symptoms.  She continues to have palpitations multiple times each day.  Typically it lasts for a few seconds.  However at other times it lasts for longer periods of time and seems to improve with the cough.  She continues to be very active and exercises almost every day by lifting weights, doing Pilates, and doing yoga.  Since increasing the metoprolol she has been too tired to ride the elliptical.  She denies any lower extremity edema, orthopnea, or PND.   Past Medical History:  Diagnosis Date  . History of chicken pox   . Hyperlipidemia   . LBBB (left bundle branch block)   . Migraines   . Schatzki's ring   . SVT (supraventricular tachycardia) ([place])   . Tachycardia     Past Surgical History:  Procedure Laterality Date  . HYSTERECTOMY ABDOMINAL WITH SALPINGECTOMY  03/30/1991  . [place] SURGERY  03/19/1997  . MASTECTOMY Left    Due to surgical complications related to previous breast implant  . PLACEMENT OF BREAST IMPLANTS  03/19/2013  . TONSILLECTOMY  03/20/1983     Current Outpatient Medications  Medication Sig Dispense Refill  . dexlansoprazole  (DEXILANT) 60 MG capsule Take 1 capsule (60 mg total) by mouth daily. 90 capsule 3  . estradiol (CLIMARA - DOSED IN MG/24 HR) 0.075 mg/24hr patch Place 1 patch (0.075 mg total) onto the skin 2 (two) times a week. 24 patch 3  . fluticasone (FLONASE) 50 MCG/ACT nasal spray Place 2 sprays into both nostrils daily. 16 g 3  . ranitidine (ZANTAC) 75 MG tablet Take 75 mg by mouth 2 (two) times daily.    Marland Kitchen diltiazem (CARDIZEM CD) 120 MG 24 hr capsule Take 1 capsule (120 mg total) by mouth daily. 90 capsule 3   No current facility-administered medications for this visit.     Allergies:   Molds & smuts and Pollen extract    Social History:  The patient  reports that she has never smoked. She has quit using smokeless tobacco. She reports that she drinks alcohol.   Family History:  The patient's family history includes Alcohol abuse in her brother and brother; Arthritis in her maternal grandmother, mother, and paternal grandmother; Breast cancer in her sister; Cancer in her brother and father; Congenital heart disease in her mother; Congestive Heart Failure in her mother; Drug abuse in her brother; Early death in her brother and brother; Hearing loss in her mother; Heart attack in her brother and paternal grandmother; Hypercholesterolemia in her maternal grandmother, paternal grandmother, and son; Hyperlipidemia in her mother; Hypertension in her maternal grandmother, mother, paternal grandmother, and  son; Kidney disease in her mother; Stroke in her maternal grandmother.    ROS:  Please see the history of present illness.   Otherwise, review of systems are positive for none.   All other systems are reviewed and negative.    PHYSICAL EXAM: VS:  BP (!) 121/57   Pulse 61   Ht 5\' 6"  (1.676 m)   Wt 113 lb 12.8 oz (51.6 kg)   BMI 18.37 kg/m  , BMI Body mass index is 18.37 kg/m. GENERAL:  Well appearing HEENT: Pupils equal round and reactive, fundi not visualized, oral mucosa unremarkable NECK:  No  jugular venous distention, waveform within normal limits, carotid upstroke brisk and symmetric, no bruits, no thyromegaly LYMPHATICS:  No cervical adenopathy LUNGS:  Clear to auscultation bilaterally HEART:  RRR.  PMI not displaced or sustained,S1 and S2 within normal limits, no S3, no S4, no clicks, no rubs, no murmurs ABD:  Flat, positive bowel sounds normal in frequency in pitch, no bruits, no rebound, no guarding, no midline pulsatile mass, no hepatomegaly, no splenomegaly EXT:  2 plus pulses throughout, no edema, no cyanosis no clubbing SKIN:  No rashes no nodules NEURO:  Cranial nerves II through XII grossly intact, motor grossly intact throughout PSYCH:  Cognitively intact, oriented to person place and time  EKG:  EKG is not ordered today. The ekg ordered 07/11/16 demonstrates Sinus rhythm. Rate 64 bpm. 07/15/17: Sinus rhythm.   Rate 60 pm.  Non-specific ST changes.  She was  13 day [place]iop[place] 10/04/15:  Sinus rhythm. Rare PVCs and PACs. 113 runs of wide complex tachycardia suspicious for SVT with a rate related bundle. Longest episode lasting 6 minutes 52 seconds at 130 bpm.  Echo 07/21/15: LVEF 55%. Mild MR/TR. RVSP 25  Cardiac CT-A2/9/17: Limited by artifact engaging Cardiac catheterization 08/01/15: Normal coronary arteries. LVEF 60%.  Recent Labs: 07/15/2017: ALT 19; BUN 7; Creatinine, Ser 0.84; Hemoglobin 12.5; Magnesium 2.2; Platelets 311; Potassium 4.9; Sodium 138; TSH 0.845   07/21/15: Sodium 138, potassium 4.6, BUN 12, creatinine 0.7 WBC 4.1, hemoglobin 13.0, hematocrit 38.5, platelets 28 Total cholesterol 212, trig lisuride 70, HDL 88, LDL 110   Lipid Panel    Component Value Date/Time   CHOL 228 (A) 04/30/2017   TRIG 76 04/30/2017   HDL 93 (A) 04/30/2017   CHOLHDL 2.2 07/11/2016 0815   VLDL 13 07/11/2016 0815   LDLCALC 120 04/30/2017      Wt Readings from Last 3 Encounters:  09/02/17 113 lb 12.8 oz (51.6 kg)  07/30/17 116 lb 9.6 oz (52.9 kg)  07/16/17 117 lb  12.8 oz (53.4 kg)      ASSESSMENT AND PLAN:  # SVT: Labs unremarkable.  She continues to have symptoms despite increasing metoprolol and has fatigue that prohibits titration.  We will try diltiazem 120mg  daily.  Stop metoprolol.  We will have her seen by EP for consideration of antiarrhythmics or ablation.  She is not excited about ablation but would consider it.   # CV Disease Prevention: Ms. Pauli was congratulated on her excellent exercise regimen.  Check lipids and CMP.     Current medicines are reviewed at length with the patient today.  The patient does not have concerns regarding medicines.  The following changes have been made:  Stop metoprolol.  Start diltiazem.   Labs/ tests ordered today include:   Orders Placed This Encounter  Procedures  . Ambulatory referral to Cardiac Electrophysiology     Disposition:   FU with Randie Bloodgood C.  Oval Linsey, MD, [facility] in 3 months.     Signed, Kayce Chismar C. Oval Linsey, MD, [facility]  09/02/2017 12:15 PM    [facility]

## 2017-09-02 NOTE — Patient Instructions (Addendum)
Medication Instructions:  STOP METOPROLOL  START DILTIAZEM 120 MG DAILY   Labwork: NONE  Testing/Procedures: NONE  Follow-Up: Your physician recommends that you schedule a follow-up appointment in: DR [place] 3 MONTHS  You have been referred to ELECTROPHYSIOLOGY (EP) IN ABOUT 1 MONTH  IF YOU DO NOT HEAR FROM THE OFFICE YOU CALL [phone]

## 2017-09-10 ENCOUNTER — Encounter: Payer: Self-pay | Admitting: Cardiovascular Disease

## 2017-09-10 ENCOUNTER — Ambulatory Visit (INDEPENDENT_AMBULATORY_CARE_PROVIDER_SITE_OTHER): Payer: Medicare Other | Admitting: Vascular Surgery

## 2017-09-10 ENCOUNTER — Telehealth: Payer: Self-pay | Admitting: Cardiovascular Disease

## 2017-09-10 ENCOUNTER — Encounter: Payer: Self-pay | Admitting: Vascular Surgery

## 2017-09-10 VITALS — BP 101/60 | HR 79 | Temp 97.0°F | Resp 16 | Ht 66.5 in | Wt 113.0 lb

## 2017-09-10 DIAGNOSIS — I83893 Varicose veins of bilateral lower extremities with other complications: Secondary | ICD-10-CM | POA: Diagnosis not present

## 2017-09-10 NOTE — Telephone Encounter (Signed)
New message    Pt c/o medication issue:  1. Name of Medication: Diltiazem  2. How are you currently taking this medication (dosage and times per day)? 120 mg daily   3. Are you having a reaction (difficulty breathing--STAT)? Tired, lethargic, no appetite  4. What is your medication issue? Pt said she is having symptoms she was told to call with these symptoms. And it hasn't helped her heart palpitations that much.

## 2017-09-10 NOTE — Telephone Encounter (Signed)
Returned call to pt she states that she recently stopped metoprolol to dilt. She states that the dilt is not working and "does not work for her" she would like to go back to taking metoprolol this seems to work better as the [place] has not helped with her palp as they have increased since starting the [place]. She states that she is more lethargic and tired. She states that she is an active person and she does not want to get out of the bed and she does not trust herself to drive she denies any other sx no CP or pressure, SOB or any other sx. She does have an appt with Dr Lequita Halt [phone].  Can she stop the dilt and go back to metoprolol? Please advise

## 2017-09-10 NOTE — Progress Notes (Signed)
Vascular and Vein Specialist of [facility]  Patient name: Kristin Hinton MRN: [id number] DOB: Feb 18, 1946 Sex: female  REASON FOR CONSULT: Establish venous care  HPI: Kristin Hinton is a 72 y.o. female, who is here today with her husband.  She underwent treatment with ablation of her great and small saphenous vein bilaterally in [place].  She has now moved to this area and wishes to establish care with someone in our area.  She does not have any pain associated with any lower extremity pathology.  She is quite active with no other major medical difficulties.  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  She had seen in outlying vein center and wishes to establish her care here.  Past Medical History:  Diagnosis Date  . History of chicken pox   . Hyperlipidemia   . LBBB (left bundle branch block)   . Migraines   . Schatzki's ring   . SVT (supraventricular tachycardia) ([place])   . Tachycardia     Family History  Problem Relation Age of Onset  . Congenital heart disease Mother   . Congestive Heart Failure Mother   . Hyperlipidemia Mother   . Hypertension Mother   . Kidney disease Mother   . Arthritis Mother   . Hearing loss Mother   . Cancer Father        Lung  . Breast cancer Sister   . Cancer Brother   . Alcohol abuse Brother   . Drug abuse Brother   . Kristin Hinton death Brother   . Heart attack Brother   . Stroke Maternal Grandmother   . Arthritis Maternal Grandmother   . Hypercholesterolemia Maternal Grandmother   . Hypertension Maternal Grandmother   . Arthritis Paternal Grandmother   . Heart attack Paternal Grandmother   . Hypercholesterolemia Paternal Grandmother   . Hypertension Paternal Grandmother   . Alcohol abuse Brother   . Kristin Hinton death Brother   . Hypercholesterolemia Son   . Hypertension Son     SOCIAL HISTORY: Social History   Socioeconomic History    . Marital status: Married    Spouse name: Not on file  . Number of children: Not on file  . Years of education: Not on file  . Highest education level: Not on file  Occupational History  . Not on file  Social Needs  . Financial resource strain: Not on file  . Food insecurity:    Worry: Not on file    Inability: Not on file  . Transportation needs:    Medical: Not on file    Non-medical: Not on file  Tobacco Use  . Smoking status: Never Smoker  . Smokeless tobacco: Former Network engineer and Sexual Activity  . Alcohol use: Yes    Comment: 7-10 drinks per week  . Drug use: Not on file  . Sexual activity: Not on file  Lifestyle  . Physical activity:    Days per week: Not on file    Minutes per session: Not on file  . Stress: Not on file  Relationships  . Social connections:    Talks on phone: Not on file    Gets together: Not on file    Attends religious service: Not on file    Active member of club or organization: Not on file    Attends meetings of clubs or organizations: Not on file    Relationship status: Not on file  . Intimate partner violence:  Fear of current or ex partner: Not on file    Emotionally abused: Not on file    Physically abused: Not on file    Forced sexual activity: Not on file  Other Topics Concern  . Not on file  Social History Narrative  . Not on file    Allergies  Allergen Reactions  . Molds & Smuts Itching  . Pollen Extract Itching    Current Outpatient Medications  Medication Sig Dispense Refill  . dexlansoprazole (DEXILANT) 60 MG capsule Take 1 capsule (60 mg total) by mouth daily. 90 capsule 3  . diltiazem (CARDIZEM CD) 120 MG 24 hr capsule Take 1 capsule (120 mg total) by mouth daily. 90 capsule 3  . estradiol (CLIMARA - DOSED IN MG/24 HR) 0.075 mg/24hr patch Place 1 patch (0.075 mg total) onto the skin 2 (two) times a week. 24 patch 3  . fluticasone (FLONASE) 50 MCG/ACT nasal spray Place 2 sprays into both nostrils daily. 16 g  3  . ranitidine (ZANTAC) 75 MG tablet Take 75 mg by mouth 2 (two) times daily.     No current facility-administered medications for this visit.     REVIEW OF SYSTEMS:  '[X]'  denotes positive finding, '[ ]'  denotes negative finding Cardiac  Comments:  Chest pain or chest pressure:    Shortness of breath upon exertion:    Short of breath when lying flat:    Irregular heart rhythm: x       Vascular    Pain in calf, thigh, or hip brought on by ambulation:    Pain in feet at night that wakes you up from your sleep:     Blood clot in your veins:    Leg swelling:         Pulmonary    Oxygen at home:    Productive cough:     Wheezing:         Neurologic    Sudden weakness in arms or legs:     Sudden numbness in arms or legs:     Sudden onset of difficulty speaking or slurred speech:    Temporary loss of vision in one eye:     Problems with dizziness:         Gastrointestinal    Blood in stool:     Vomited blood:         Genitourinary    Burning when urinating:     Blood in urine:        Psychiatric    Major depression:         Hematologic    Bleeding problems:    Problems with blood clotting too easily:        Skin    Rashes or ulcers:        Constitutional    Fever or chills:      PHYSICAL EXAM: Vitals:   09/10/17 1128  BP: 101/60  Pulse: 79  Resp: 16  Temp: (!) 97 F (36.1 C)  SpO2: 97%  Weight: 113 lb (51.3 kg)  Height: 5' 6.5" (1.689 m)    GENERAL: The patient is a well-nourished female, in no acute distress. The vital signs are documented above. CARDIOVASCULAR: Palpable radial and dorsalis pedis pulses bilaterally PULMONARY: There is good air exchange  ABDOMEN: Soft and non-tender  MUSCULOSKELETAL: There are no major deformities or cyanosis. NEUROLOGIC: No focal weakness or paresthesias are detected. SKIN: There are no ulcers or rashes noted.  Gathered telangiectasia on both lower extremities pSYCHIATRIC:  The patient has a normal affect.  DATA:    Her venous duplex from [place] [facility].  This did show closure of her great and small saphenous veins.  Legs with SonoSite ultrasound and agree that there is no evidence of enlarged saphenous vein bilaterally  MEDICAL ISSUES: I reassured the patient that I do not see any evidence of venous pathology.  She is interested in sclerotherapy for telangiectasia for cosmetic concerns.  She met with Thea Silversmith RN to discuss timing of this.   Rosetta Posner, MD FACS [facility] Tel [phone] Pager [phone]

## 2017-09-11 NOTE — Telephone Encounter (Signed)
Thank you. Keep EP consult as scheduled.

## 2017-09-11 NOTE — Telephone Encounter (Signed)
Follow up    Patient is returning call to see if she can resume the metoprolol. Due to the side effects of the diltiazem.    Pt c/o medication issue:  1. Name of Medication: diltiazem  2. How are you currently taking this medication (dosage and times per day)? 120mg    3. Are you having a reaction (difficulty breathing--STAT)? Tired, lethargic, no appetite  4. What is your medication issue? Palps not improving.

## 2017-09-11 NOTE — Telephone Encounter (Signed)
Per verbal from MD, OK to stop diltiazem and resume metoprolol succinate at previous dose of 50mg 

## 2017-09-13 ENCOUNTER — Encounter: Payer: Self-pay | Admitting: Internal Medicine

## 2017-09-18 ENCOUNTER — Encounter: Payer: Self-pay | Admitting: Family Medicine

## 2017-09-20 ENCOUNTER — Other Ambulatory Visit: Payer: Self-pay

## 2017-09-20 MED ORDER — FLUTICASONE PROPIONATE 50 MCG/ACT NA SUSP
2.0000 | Freq: Every day | NASAL | 3 refills | Status: DC
Start: 2017-09-20 — End: 2018-10-17

## 2017-09-20 MED ORDER — FLUTICASONE PROPIONATE 50 MCG/ACT NA SUSP: 2.0000 | Freq: Every day | NASAL | 3 refills | Status: DC

## 2017-09-23 ENCOUNTER — Ambulatory Visit (INDEPENDENT_AMBULATORY_CARE_PROVIDER_SITE_OTHER): Payer: Medicare Other | Admitting: Internal Medicine

## 2017-09-23 ENCOUNTER — Encounter: Payer: Self-pay | Admitting: Internal Medicine

## 2017-09-23 VITALS — BP 106/68 | HR 63 | Ht 66.5 in | Wt 113.8 lb

## 2017-09-23 DIAGNOSIS — I471 Supraventricular tachycardia: Secondary | ICD-10-CM

## 2017-09-23 NOTE — Progress Notes (Signed)
ELECTROPHYSIOLOGY CONSULT NOTE  Patient ID: Kristin Hinton, MRN: [id number], DOB/AGE: 04/21/1945 72 y.o. Admit date: (Not on file) Date of Consult: 09/23/2017  Primary Physician: Lucille Passy, MD Primary Cardiologist: TR     Kristin Hinton is a 72 y.o. female who is being seen today for the evaluation of SVT at the request of TR.    HPI Kristin Hinton is a 72 y.o. female has a 50-year history of recurrent abrupt onset offset tachypalpitations which she is able to interrupt by coughing.  They were diagnosed at [facility] 2017 as SVT.  Cover sheet was reviewed and described SVT at 200 bpm with some aberration as well as SVT at 135 with possible atrial tachycardia.  She has been treated with beta-blockers and calcium blockers which she has not tolerated well.  She is relatively fit.  These episodes do not largely interfere with exercise.  They are not set associated with any specific activity.  Typically last seconds.  Event recorder describes the duration is up to 6 minutes.  They are associated with weakness fatigue and lightheadedness.  Echocardiogram at [facility] was read as normal Past Medical History:  Diagnosis Date  . History of chicken pox   . Hyperlipidemia   . LBBB (left bundle branch block)   . Migraines   . Schatzki's ring   . SVT (supraventricular tachycardia) ([place])   . Tachycardia       Surgical History:  Past Surgical History:  Procedure Laterality Date  . HYSTERECTOMY ABDOMINAL WITH SALPINGECTOMY  03/30/1991  . [place] SURGERY  03/19/1997  . MASTECTOMY Left    Due to surgical complications related to previous breast implant  . PLACEMENT OF BREAST IMPLANTS  03/19/2013  . TONSILLECTOMY  03/20/1983     Home Meds: Prior to Admission medications   Medication Sig Start Date End Date Taking? Authorizing Provider  dexlansoprazole (DEXILANT) 60 MG capsule Take 1 capsule (60 mg total) by mouth daily. 08/13/17  Yes Lucille Passy, MD    estradiol (CLIMARA - DOSED IN MG/24 HR) 0.075 mg/24hr patch Place 1 patch (0.075 mg total) onto the skin 2 (two) times a week. 08/22/17  Yes Lucille Passy, MD  fluticasone [facility]) 50 MCG/ACT nasal spray Place 2 sprays into both nostrils daily. 09/20/17  Yes Lucille Passy, MD  metoprolol succinate (TOPROL-XL) 25 MG 24 hr tablet Take 50 mg by mouth daily.   Yes [provider]  ranitidine (ZANTAC) 75 MG tablet Take 75 mg by mouth 2 (two) times daily.   Yes [provider]    Allergies:  Allergies  Allergen Reactions  . Molds & Smuts Itching  . Pollen Extract Itching    Social History   Socioeconomic History  . Marital status: Married    Spouse name: Not on file  . Number of children: Not on file  . Years of education: Not on file  . Highest education level: Not on file  Occupational History  . Not on file  Social Needs  . Financial resource strain: Not on file  . Food insecurity:    Worry: Not on file    Inability: Not on file  . Transportation needs:    Medical: Not on file    Non-medical: Not on file  Tobacco Use  . Smoking status: Never Smoker  . Smokeless tobacco: Former Network engineer and Sexual Activity  . Alcohol use: Yes    Comment: 7-10 drinks per week  . Drug  use: Never  . Sexual activity: Not on file  Lifestyle  . Physical activity:    Days per week: Not on file    Minutes per session: Not on file  . Stress: Not on file  Relationships  . Social connections:    Talks on phone: Not on file    Gets together: Not on file    Attends religious service: Not on file    Active member of club or organization: Not on file    Attends meetings of clubs or organizations: Not on file    Relationship status: Not on file  . Intimate partner violence:    Fear of current or ex partner: Not on file    Emotionally abused: Not on file    Physically abused: Not on file    Forced sexual activity: Not on file  Other Topics Concern  . Not on file  Social  History Narrative  . Not on file     Family History  Problem Relation Age of Onset  . Congenital heart disease Mother   . Congestive Heart Failure Mother   . Hyperlipidemia Mother   . Hypertension Mother   . Kidney disease Mother   . Arthritis Mother   . Hearing loss Mother   . Cancer Father        Lung  . Breast cancer Sister   . Cancer Brother   . Alcohol abuse Brother   . Drug abuse Brother   . Early death Brother   . Heart attack Brother   . Stroke Maternal Grandmother   . Arthritis Maternal Grandmother   . Hypercholesterolemia Maternal Grandmother   . Hypertension Maternal Grandmother   . Arthritis Paternal Grandmother   . Heart attack Paternal Grandmother   . Hypercholesterolemia Paternal Grandmother   . Hypertension Paternal Grandmother   . Alcohol abuse Brother   . Early death Brother   . Hypercholesterolemia Son   . Hypertension Son      ROS:  Please see the history of present illness.     All other systems reviewed and negative.    Physical Exam  Blood pressure 106/68, pulse 63, height 5' 6.5" (1.689 m), weight 113 lb 12.8 oz (51.6 kg), SpO2 98 %. General: Well developed, well nourished female in no acute distress. Head: Normocephalic, atraumatic, sclera non-icteric, no xanthomas, nares are without discharge. EENT: normal  Lymph Nodes:  none Neck: Negative for carotid bruits. JVD not elevated. Back:without scoliosis kyphosis  Lungs: Clear bilaterally to auscultation without wheezes, rales, or rhonchi. Breathing is unlabored. Heart: RRR with S1 S2. No   murmur . No rubs, or gallops appreciated. Abdomen: Soft, non-tender, non-distended with normoactive bowel sounds. No hepatomegaly. No rebound/guarding. No obvious abdominal masses. Msk:  Strength and tone appear normal for age. Extremities: No clubbing or cyanosis. No* edema.  Distal pedal pulses are 2+ and equal bilaterally. Skin: Warm and Dry Neuro: Alert and oriented X 3. CN III-XII intact Grossly normal  sensory and motor function . Psych:  Responds to questions appropriately with a normal affect.      Labs: Cardiac Enzymes No results for input(s): CKTOTAL, CKMB, TROPONINI in the last 72 hours. CBC Lab Results  Component Value Date   WBC 4.7 07/15/2017   HGB 12.5 07/15/2017   HCT 38.1 07/15/2017   MCV 95 07/15/2017   PLT 311 07/15/2017   PROTIME: No results for input(s): LABPROT, INR in the last 72 hours. Chemistry No results for input(s): NA, K, CL, CO2, BUN,  CREATININE, CALCIUM, PROT, BILITOT, ALKPHOS, ALT, AST, GLUCOSE in the last 168 hours.  Invalid input(s): LABALBU Lipids Lab Results  Component Value Date   CHOL 228 (A) 04/30/2017   HDL 93 (A) 04/30/2017   LDLCALC 120 04/30/2017   TRIG 76 04/30/2017   BNP No results found for: PROBNP Thyroid Function Tests: No results for input(s): TSH, T4TOTAL, T3FREE, THYROIDAB in the last 72 hours.  Invalid input(s): FREET3 Miscellaneous No results found for: DDIMER  Radiology/Studies:  No results found.  EKG: Sinsu without preexcitaztion   Assessment and Plan:  Tachycardia described as wide-complex with SVT and aberration as well as possible atrial tachycardia    The patient has frequent episodes of tachypalpitations occurring multiple times a day.  She is not particularly inclined towards ablation.  She has not tolerated beta-blockers or diltiazem well; however, she is accommodated to it over the last 50 years quite well.  Given the descriptions from the cover sheet of her last monitor, therapeutic options would be informed by more clearly understanding mechanisms.  We will use a ZIO Patch.    More than 50% of 85 min was spent in counseling related to the above     Virl Axe

## 2017-09-23 NOTE — Patient Instructions (Addendum)
Medication Instructions:  Your physician recommends that you continue on your current medications as directed. Please refer to the Current Medication list given to you today.  Labwork: None ordered.  Testing/Procedures: Your physician has suggested you have an event monitor placed.   Follow-Up: Your physician wants you to follow-up in: September, after you have had your monitor placed and evaluated.     Any Other Special Instructions Will Be Listed Below (If Applicable).     If you need a refill on your cardiac medications before your next appointment, please call your pharmacy.

## 2017-09-27 ENCOUNTER — Ambulatory Visit (INDEPENDENT_AMBULATORY_CARE_PROVIDER_SITE_OTHER): Payer: Medicare Other

## 2017-09-27 DIAGNOSIS — I471 Supraventricular tachycardia: Secondary | ICD-10-CM | POA: Diagnosis not present

## 2017-09-30 ENCOUNTER — Other Ambulatory Visit: Payer: Self-pay | Admitting: Cardiovascular Disease

## 2017-09-30 MED ORDER — METOPROLOL SUCCINATE ER 25 MG PO TB24: 50.0000 mg | ORAL_TABLET | Freq: Every day | ORAL | 3 refills | Status: DC

## 2017-09-30 MED ORDER — METOPROLOL SUCCINATE ER 25 MG PO TB24: 50.0000 mg | ORAL_TABLET | Freq: Every day | ORAL | 0 refills | Status: DC

## 2017-09-30 NOTE — Telephone Encounter (Signed)
New message    !!please also send 90 day supply to  [place], [place]  1. Which medications need to be refilled? (please list name of each medication and dose if known) metoprolol succinate (TOPROL-XL) 50  2. Which pharmacy/location (including street and city if local pharmacy) is medication to be sent to? [id number] - [place], [address] AT [address]  3. Do they need a 30 day or 90 day supply? [place]

## 2017-09-30 NOTE — Telephone Encounter (Signed)
Rx(s) sent to both pharmacies electronically.

## 2017-10-16 DIAGNOSIS — R002 Palpitations: Secondary | ICD-10-CM | POA: Diagnosis not present

## 2017-10-23 ENCOUNTER — Telehealth: Payer: Self-pay | Admitting: Internal Medicine

## 2017-10-23 NOTE — Telephone Encounter (Signed)
New message   Patient calling for monitor results 

## 2017-10-23 NOTE — Telephone Encounter (Signed)
Pt understands Dr Caryl Comes is out of the office. I will contact her back with results and recommendations when he returns. Pt understands we would have called her with any acute rhythms. Pt verbalized understanding and had no additional questions.

## 2017-11-08 ENCOUNTER — Telehealth: Payer: Self-pay | Admitting: Internal Medicine

## 2017-11-08 NOTE — Telephone Encounter (Signed)
New Message:   Patient calling about some lab results from her MY Chart.

## 2017-11-08 NOTE — Telephone Encounter (Signed)
Pt called to request her monitor results. I advised her that there was no SVT but we are still waiting to hear back from Dr. Caryl Comes.. She reports that she has been feeling well but still experiencing palpitations but without any other symptoms. She is still taking her metoprolol at night and says she has been doing well on it. She is following up with Dr. Caryl Comes 9/19 and I advised her that I will forward her message to Dr. Caryl Comes to see if he has any further recommendations for her based on her monitor results. Pt agreed and will call if she develops any other problems or symptoms.

## 2017-11-11 DIAGNOSIS — Z1231 Encounter for screening mammogram for malignant neoplasm of breast: Secondary | ICD-10-CM | POA: Diagnosis not present

## 2017-11-12 NOTE — Telephone Encounter (Signed)
Follow Up: ° ° ° °Returning your call from this morning. °

## 2017-11-12 NOTE — Telephone Encounter (Signed)
Reviewed monitor report with pt. At this time, Dr Caryl Comes has no additional recommendations except for her to follow up with him in September. Pt understands if her sx begin to worsen, she is to call the office for evaluation. Pt has verbalized understanding and had no additional questions.

## 2017-12-03 ENCOUNTER — Ambulatory Visit: Payer: Medicare Other | Admitting: Cardiovascular Disease

## 2017-12-05 ENCOUNTER — Encounter: Payer: Self-pay | Admitting: Internal Medicine

## 2017-12-05 ENCOUNTER — Ambulatory Visit (INDEPENDENT_AMBULATORY_CARE_PROVIDER_SITE_OTHER): Payer: Medicare Other | Admitting: Internal Medicine

## 2017-12-05 VITALS — BP 116/70 | HR 69 | Ht 66.5 in | Wt 116.0 lb

## 2017-12-05 DIAGNOSIS — I447 Left bundle-branch block, unspecified: Secondary | ICD-10-CM

## 2017-12-05 DIAGNOSIS — I471 Supraventricular tachycardia: Secondary | ICD-10-CM | POA: Diagnosis not present

## 2017-12-05 MED ORDER — PROPRANOLOL HCL ER 60 MG PO CP24: 60.0000 mg | ORAL_CAPSULE | Freq: Every day | ORAL | 0 refills | Status: DC

## 2017-12-05 MED ORDER — BISOPROLOL FUMARATE 5 MG PO TABS: 2.5000 mg | ORAL_TABLET | Freq: Every day | ORAL | 0 refills | Status: DC

## 2017-12-05 MED ORDER — NEBIVOLOL HCL 2.5 MG PO TABS: 2.5000 mg | ORAL_TABLET | Freq: Every day | ORAL | 0 refills | Status: DC

## 2017-12-05 NOTE — Progress Notes (Signed)
ELECTROPHYSIOLOGY OFFICE NOTE  Patient ID: Kristin Hinton, MRN: [id number], DOB/AGE: [birth year] 72 y.o. Admit date: (Not on file) Date of Consult: 12/05/2017  Primary Physician: Lucille Passy, MD Primary Cardiologist: TR     Kristin Hinton is a 72 y.o. female who is being seen today for the evaluation of SVT at the request of TR.    HPI Kristin Hinton is a 72 y.o. female has a 50-year history of recurrent abrupt onset offset tachypalpitations which she is able to interrupt by coughing.  They were diagnosed at [facility] 2017 as SVT.  Cover sheet was reviewed and described SVT at 200 bpm with some aberration as well as SVT at 135 with possible atrial tachycardia.  She has been treated with beta-blockers and calcium blockers which she has not tolerated well.  She is relatively fit.  These episodes do not largely interfere with exercise.  Review of monitor was reviewed with the patient.  She had occasional nonsustained atrial tachycardia, short runs of increased sinus rates, PACs and PVCs  Therapy with metoprolol tartrate was associate with significant fatigue;  Toprol succinate also has fatigue  While down in [place] she developed peripheral edema.  Resolved spontaneously  Echocardiogram at [facility] was read as normal Past Medical History:  Diagnosis Date  . History of chicken pox   . Hyperlipidemia   . LBBB (left bundle branch block)   . Migraines   . Schatzki's ring   . SVT (supraventricular tachycardia) ([place])   . Tachycardia       Surgical History:  Past Surgical History:  Procedure Laterality Date  . HYSTERECTOMY ABDOMINAL WITH SALPINGECTOMY  03/30/1991  . [place] SURGERY  03/19/1997  . MASTECTOMY Left    Due to surgical complications related to previous breast implant  . PLACEMENT OF BREAST IMPLANTS  03/19/2013  . TONSILLECTOMY  03/20/1983     Home Meds: Prior to Admission medications   Medication Sig Start Date End Date Taking?  Authorizing Provider  dexlansoprazole (DEXILANT) 60 MG capsule Take 1 capsule (60 mg total) by mouth daily. 08/13/17  Yes Lucille Passy, MD  estradiol (CLIMARA - DOSED IN MG/24 HR) 0.075 mg/24hr patch Place 1 patch (0.075 mg total) onto the skin 2 (two) times a week. 08/22/17  Yes Lucille Passy, MD  fluticasone [facility]) 50 MCG/ACT nasal spray Place 2 sprays into both nostrils daily. 09/20/17  Yes Lucille Passy, MD  metoprolol succinate (TOPROL-XL) 25 MG 24 hr tablet Take 50 mg by mouth daily.   Yes [provider]  ranitidine (ZANTAC) 75 MG tablet Take 75 mg by mouth 2 (two) times daily.   Yes [provider]    Allergies:  Allergies  Allergen Reactions  . Molds & Smuts Itching  . Pollen Extract Itching    Social History   Socioeconomic History  . Marital status: Married    Spouse name: Not on file  . Number of children: Not on file  . Years of education: Not on file  . Highest education level: Not on file  Occupational History  . Not on file  Social Needs  . Financial resource strain: Not on file  . Food insecurity:    Worry: Not on file    Inability: Not on file  . Transportation needs:    Medical: Not on file    Non-medical: Not on file  Tobacco Use  . Smoking status: Never Smoker  . Smokeless tobacco: Former Network engineer and  Sexual Activity  . Alcohol use: Yes    Comment: 7-10 drinks per week  . Drug use: Never  . Sexual activity: Not on file  Lifestyle  . Physical activity:    Days per week: Not on file    Minutes per session: Not on file  . Stress: Not on file  Relationships  . Social connections:    Talks on phone: Not on file    Gets together: Not on file    Attends religious service: Not on file    Active member of club or organization: Not on file    Attends meetings of clubs or organizations: Not on file    Relationship status: Not on file  . Intimate partner violence:    Fear of current or ex partner: Not on file    Emotionally  abused: Not on file    Physically abused: Not on file    Forced sexual activity: Not on file  Other Topics Concern  . Not on file  Social History Narrative  . Not on file     Family History  Problem Relation Age of Onset  . Congenital heart disease Mother   . Congestive Heart Failure Mother   . Hyperlipidemia Mother   . Hypertension Mother   . Kidney disease Mother   . Arthritis Mother   . Hearing loss Mother   . Cancer Father        Lung  . Breast cancer Sister   . Cancer Brother   . Alcohol abuse Brother   . Drug abuse Brother   . Early death Brother   . Heart attack Brother   . Stroke Maternal Grandmother   . Arthritis Maternal Grandmother   . Hypercholesterolemia Maternal Grandmother   . Hypertension Maternal Grandmother   . Arthritis Paternal Grandmother   . Heart attack Paternal Grandmother   . Hypercholesterolemia Paternal Grandmother   . Hypertension Paternal Grandmother   . Alcohol abuse Brother   . Early death Brother   . Hypercholesterolemia Son   . Hypertension Son      ROS:  Please see the history of present illness.     All other systems reviewed and negative.    Physical Exam  Blood pressure 116/70, pulse 69, height 5' 6.5" (1.689 m), weight 116 lb (52.6 kg), SpO2 98 %. Well developed and nourished in no acute distress HENT normal Neck supple with JVP-flat Clear Regular rate and rhythm, no murmurs or gallops Abd-soft with active BS No Clubbing cyanosis edema Skin-warm and dry A & Oriented  Grossly normal sensory and motor function      Labs: Cardiac Enzymes No results for input(s): CKTOTAL, CKMB, TROPONINI in the last 72 hours. CBC Lab Results  Component Value Date   WBC 4.7 07/15/2017   HGB 12.5 07/15/2017   HCT 38.1 07/15/2017   MCV 95 07/15/2017   PLT 311 07/15/2017   PROTIME: No results for input(s): LABPROT, INR in the last 72 hours. Chemistry No results for input(s): NA, K, CL, CO2, BUN, CREATININE, CALCIUM, PROT, BILITOT,  ALKPHOS, ALT, AST, GLUCOSE in the last 168 hours.  Invalid input(s): LABALBU Lipids Lab Results  Component Value Date   CHOL 228 (A) 04/30/2017   HDL 93 (A) 04/30/2017   LDLCALC 120 04/30/2017   TRIG 76 04/30/2017   BNP No results found for: PROBNP Thyroid Function Tests: No results for input(s): TSH, T4TOTAL, T3FREE, THYROIDAB in the last 72 hours.  Invalid input(s): FREET3 Miscellaneous No results found for:  DDIMER  Radiology/Studies:  No results found.  EKG: Sinus @ 69 13/10/42   Assessment and Plan:  Tachycardia described as wide-complex with SVT and aberration as well as possible atrial tachycardia  PAC   PVCs  The patient's rhythms were reviewed.  Nothing sustained was noted.  I have assured her that none of what we have seen is life-threatening.  We discussed the importance of finding medications that are better tolerated so as to minimize quality of life effects.  I have given her prescriptions to Inderal LA 60, nebivolol 2.5 and bisoprolol 2.5.  We will see her again in about 3 months.  We spent more than 50% of our >25 min visit in face to face counseling regarding the above        Virl Axe

## 2017-12-05 NOTE — Patient Instructions (Signed)
Medication Instructions:  Your physician has recommended you make the following change in your medication:   You are being given three prescriptions for different beta blockers to try.  You may take these in any order. DO NOT TAKE MORE THAN ONE BETA BLOCKER AT A TIME. Choose one beta blocker to start with. If after two weeks, you feel the medication is working well for you, continue that current medication. If it does not work well for you, try another prescription for a beta blocker at that time. You may continue that current beta blocker at that time if it works well for you. If it does not, repeat these instructions for the third beta blocker.  When you find a beta blocker that works well for you, call the office and we will send in a prescription for you.               1. Inderal LA 60mg  tablet             2. Nebivolol 2.5 mg tablet             3. Bisoprolol 2.5mg  tablet  You may take these in any order you please.   Please call us with any questions.   Labwork: None ordered.  Testing/Procedures: None ordered.  Follow-Up: Your physician recommends that you schedule a follow-up appointment in:   3 months with Dillon Bjork, PA   Any Other Special Instructions Will Be Listed Below (If Applicable).     If you need a refill on your cardiac medications before your next appointment, please call your pharmacy.

## 2017-12-13 ENCOUNTER — Ambulatory Visit: Payer: Medicare Other | Admitting: Cardiovascular Disease

## 2017-12-20 ENCOUNTER — Encounter: Payer: Self-pay | Admitting: Cardiovascular Disease

## 2017-12-20 ENCOUNTER — Ambulatory Visit (INDEPENDENT_AMBULATORY_CARE_PROVIDER_SITE_OTHER): Payer: Medicare Other | Admitting: Cardiovascular Disease

## 2017-12-20 VITALS — BP 106/68 | HR 59 | Ht 66.0 in | Wt 117.0 lb

## 2017-12-20 DIAGNOSIS — I471 Supraventricular tachycardia: Secondary | ICD-10-CM | POA: Diagnosis not present

## 2017-12-20 DIAGNOSIS — I491 Atrial premature depolarization: Secondary | ICD-10-CM

## 2017-12-20 MED ORDER — PROPRANOLOL HCL ER 80 MG PO CP24: 80.0000 mg | ORAL_CAPSULE | Freq: Every day | ORAL | 3 refills | Status: DC

## 2017-12-20 NOTE — Patient Instructions (Addendum)
Medication Instructions:  INCREASE YOUR PROPRANOLOL TO 80 MG DAILY   If you need a refill on your cardiac medications before your next appointment, please call your pharmacy.   Lab work: NONE  Testing/Procedures: NONE  Follow-Up: WITH DR Caryl Comes AS RECOMMENDED

## 2017-12-20 NOTE — Progress Notes (Signed)
Cardiology Office Note   Date:  12/20/2017   ID:  Kristin Hinton, DOB 30-Jan-1946, MRN [id number]  PCP:  Lucille Passy, MD  Cardiologist:   Skeet Latch, MD   Chief Complaint  Patient presents with  . Follow-up    3 months  . Edema    Ankles.     History of Present Illness: Kristin Hinton is a 72 y.o. female with a rate related left bundle branch block, SVT, and Schatzki's ring here for follow up.  She was initially seen 06/2016 to establish care after being seen by [facility] Cardiology Specialist.  She had and episode of lightheadedness and underwent a left heart catheterization 07/2015 that reportedly showed normal coronaries.  Her SVT was diagnosed in her 69s and has been managed with metoprolol.  At her last appointment she noted increased palpitations.  Metoprolol was increased to 50mg  qhs.  Since that time she has not noted any improvement in her symptoms.  She continues to have palpitations multiple times each day.  Typically it lasts for a few seconds.  However at other times it lasts for longer periods of time and seems to improve with the cough.  She continues to be very active and exercises almost every day by lifting weights, doing Pilates, and doing yoga.  Since increasing the metoprolol she has been too tired to ride the elliptical.  She denies any lower extremity edema, orthopnea, or PND.  At her last appointment metoprolol was switched to diltiazem due to persistent SVT and low blood pressure prohibiting titration.  She also saw Dr. Caryl Comes 09/2017.  She was not interested in ablation at that time.  She was referred for a ZIO Patch that revealed mostly sinus rhythms with isolated PACs and a nonsustained atrial tachycardia with aberrancy.  He gave her prescriptions of propranolol, nebivolol, and bisoprolol to see if she tolerated any of those better.  She has been feeling better.  She has much more energy since stopping metoprolol.  She continues to have episodes of palpitations  daily.  They last for a few seconds at a time.  Lately she thinks is happening a little more frequently.  There is no associated lightheadedness or dizziness.  She continues to exercise 3 days/week and occasionally has chest pain.  This typically happens when her heart is going very fast.  She has mild lower extremity edema when the weather is hot and she goes outside a lot.  She also notes it happens if she eats a lot of salty food.  She has no orthopnea or PND.   Past Medical History:  Diagnosis Date  . History of chicken pox   . Hyperlipidemia   . LBBB (left bundle branch block)   . Migraines   . Schatzki's ring   . SVT (supraventricular tachycardia) ([place])   . Tachycardia     Past Surgical History:  Procedure Laterality Date  . HYSTERECTOMY ABDOMINAL WITH SALPINGECTOMY  03/30/1991  . [place] SURGERY  03/19/1997  . MASTECTOMY Left    Due to surgical complications related to previous breast implant  . PLACEMENT OF BREAST IMPLANTS  03/19/2013  . TONSILLECTOMY  03/20/1983     Current Outpatient Medications  Medication Sig Dispense Refill  . bisoprolol (ZEBETA) 5 MG tablet Take 0.5 tablets (2.5 mg total) by mouth daily. 15 tablet 0  . dexlansoprazole (DEXILANT) 60 MG capsule Take 1 capsule (60 mg total) by mouth daily. 90 capsule 3  . estradiol (CLIMARA - DOSED IN MG/24  HR) 0.075 mg/24hr patch Place 1 patch (0.075 mg total) onto the skin 2 (two) times a week. 24 patch 3  . fluticasone (FLONASE) 50 MCG/ACT nasal spray Place 2 sprays into both nostrils daily. 48 g 3  . nebivolol (BYSTOLIC) 2.5 MG tablet Take 1 tablet (2.5 mg total) by mouth daily. 30 tablet 0  . propranolol ER (INDERAL LA) 80 MG 24 hr capsule Take 1 capsule (80 mg total) by mouth daily. 30 capsule 3  . ranitidine (ZANTAC) 75 MG tablet Take 75 mg by mouth 2 (two) times daily.     No current facility-administered medications for this visit.     Allergies:   Molds & smuts and Pollen extract    Social History:   The patient  reports that she has never smoked. She has quit using smokeless tobacco. She reports that she drinks alcohol. She reports that she does not use drugs.   Family History:  The patient's family history includes Alcohol abuse in her brother and brother; Arthritis in her maternal grandmother, mother, and paternal grandmother; Breast cancer in her sister; Cancer in her brother and father; Congenital heart disease in her mother; Congestive Heart Failure in her mother; Drug abuse in her brother; Early death in her brother and brother; Hearing loss in her mother; Heart attack in her brother and paternal grandmother; Hypercholesterolemia in her maternal grandmother, paternal grandmother, and son; Hyperlipidemia in her mother; Hypertension in her maternal grandmother, mother, paternal grandmother, and son; Kidney disease in her mother; Stroke in her maternal grandmother.    ROS:  Please see the history of present illness.   Otherwise, review of systems are positive for none.   All other systems are reviewed and negative.    PHYSICAL EXAM: VS:  BP 106/68 (BP Location: Right Arm, Patient Position: Sitting, Cuff Size: Normal)   Pulse (!) 59   Ht 5\' 6"  (1.676 m)   Wt 117 lb (53.1 kg)   BMI 18.88 kg/m  , BMI Body mass index is 18.88 kg/m. GENERAL:  Well appearing HEENT: Pupils equal round and reactive, fundi not visualized, oral mucosa unremarkable NECK:  No jugular venous distention, waveform within normal limits, carotid upstroke brisk and symmetric, no bruits LUNGS:  Clear to auscultation bilaterally HEART:  RRR.  PMI not displaced or sustained,S1 and S2 within normal limits, no S3, no S4, no clicks, no rubs, no murmurs ABD:  Flat, positive bowel sounds normal in frequency in pitch, no bruits, no rebound, no guarding, no midline pulsatile mass, no hepatomegaly, no splenomegaly EXT:  2 plus pulses throughout, trace edema, no cyanosis no clubbing SKIN:  No rashes no nodules NEURO:  Cranial  nerves II through XII grossly intact, motor grossly intact throughout PSYCH:  Cognitively intact, oriented to person place and time  EKG:  EKG is not ordered today. The ekg ordered 07/11/16 demonstrates Sinus rhythm. Rate 64 bpm. 07/15/17: Sinus rhythm.   Rate 60 pm.  Non-specific ST changes.  She was  13 day Ziopatch 10/04/15:  Sinus rhythm. Rare PVCs and PACs. 113 runs of wide complex tachycardia suspicious for SVT with a rate related bundle. Longest episode lasting 6 minutes 52 seconds at 130 bpm.  Echo 07/21/15: LVEF 55%. Mild MR/TR. RVSP 25  Cardiac CT-A2/9/17: Limited by artifact engaging Cardiac catheterization 08/01/15: Normal coronary arteries. LVEF 60%.  Recent Labs: 07/15/2017: ALT 19; BUN 7; Creatinine, Ser 0.84; Hemoglobin 12.5; Magnesium 2.2; Platelets 311; Potassium 4.9; Sodium 138; TSH 0.845   07/21/15: Sodium 138, potassium 4.6,  BUN 12, creatinine 0.7 WBC 4.1, hemoglobin 13.0, hematocrit 38.5, platelets 28 Total cholesterol 212, trig lisuride 70, HDL 88, LDL 110   Lipid Panel    Component Value Date/Time   CHOL 228 (A) 04/30/2017   TRIG 76 04/30/2017   HDL 93 (A) 04/30/2017   CHOLHDL 2.2 07/11/2016 0815   VLDL 13 07/11/2016 0815   LDLCALC 120 04/30/2017      Wt Readings from Last 3 Encounters:  12/20/17 117 lb (53.1 kg)  12/05/17 116 lb (52.6 kg)  09/23/17 113 lb 12.8 oz (51.6 kg)      ASSESSMENT AND PLAN:  # SVT: # PACs: Ms. Donnellan is feeling better on propranolol.  She continues to have palpitations.  Her blood pressure somewhat low so we will not increase her propranolol by much.  We will just increase it to 80 mg daily.  She still has the prescriptions for nebivolol and bisoprolol that she will try if this does not help.  She is scheduled to see Dr. Caryl Comes in 2 months.  Given that the only reason she is seeing me is for her tachycardia arrhythmias, we will plan for her to follow-up with me as needed if she develops any non-arrhythmia cardiac symptoms.  She  remains uninterested in ablation.  # CV Disease Prevention: Ms. Brester was congratulated on her excellent exercise regimen.  Check lipids and CMP.     Current medicines are reviewed at length with the patient today.  The patient does not have concerns regarding medicines.  The following changes have been made:  Increase propranolol to 80mg  Labs/ tests ordered today include:   No orders of the defined types were placed in this encounter.    Disposition:   FU with Mylez Venable C. Oval Linsey, MD, [facility] as needed.   Signed, Ikeisha Blumberg C. Oval Linsey, MD, [facility]  12/20/2017 4:46 PM    [place]

## 2017-12-24 ENCOUNTER — Other Ambulatory Visit: Payer: Self-pay

## 2017-12-24 MED ORDER — PROPRANOLOL HCL ER 60 MG PO CP24: 60.0000 mg | ORAL_CAPSULE | Freq: Every day | ORAL | 3 refills | Status: DC

## 2017-12-26 ENCOUNTER — Other Ambulatory Visit: Payer: Self-pay

## 2017-12-26 MED ORDER — PANTOPRAZOLE SODIUM 40 MG PO TBEC: 40.0000 mg | DELAYED_RELEASE_TABLET | Freq: Every day | ORAL | 3 refills | Status: DC

## 2018-01-15 ENCOUNTER — Telehealth: Payer: Self-pay

## 2018-01-15 NOTE — Telephone Encounter (Addendum)
Fax received from [facility], the pts pharmacy, stating that Bystolic is not covered by the pts plan. The preferred alternatives are: Atenolol, Betapace, Coreg, Metoprolol Tartrate, and Propranolol.  According to the pts chart she has taken Metoprolol Tartrate and Propranolol in the past.  Please advise.

## 2018-01-16 NOTE — Telephone Encounter (Signed)
OK for the patient to go back to prior med, propranolol. Should be addressed by Caryl Comes upon his return.

## 2018-01-20 MED ORDER — PROPRANOLOL HCL ER 60 MG PO CP24: 60.0000 mg | ORAL_CAPSULE | Freq: Every day | ORAL | 0 refills | Status: DC

## 2018-01-20 NOTE — Telephone Encounter (Signed)
Pt going to remain on Propranolol 60 mg daily, until reviewed by Dr. Caryl Comes. Will send 30 day supply of Propranolol to pharmacy per pt request.  She reports this has been working, but not at a higher dose (Dr. Oval Linsey increased 10/4 b/c of continued palpitations.  D/t pt c/o fatigue she decreased it back to 60).  States the Bisoprolol did not work and her heart went crazy.  It "felt like it was going to beat out of my chest". She has not tried the [employer] and doesn't want to if her insurance won't cover it.  Informed pt that we can work on Risk analyst it if needed to try at some point.  Pt will await Caryl Comes advisement and will remain on Propranolol until then. She understands it may be next week or week after before return call.  She is agreeable to plan.

## 2018-01-28 ENCOUNTER — Telehealth: Payer: Self-pay | Admitting: *Deleted

## 2018-01-28 MED ORDER — PANTOPRAZOLE SODIUM 40 MG PO TBEC: 40.0000 mg | DELAYED_RELEASE_TABLET | Freq: Two times a day (BID) | ORAL | 1 refills | Status: DC

## 2018-01-28 NOTE — Telephone Encounter (Signed)
That is a very high dose of protonix and I do not recommend that she continue that long term.  I sent in an rx for protonix 40 mg twice daily for one month with 1 refill but I would like her to back down to 40 mg once daily once her symptoms improve.  Is she still taking ranitidine?

## 2018-01-28 NOTE — Addendum Note (Signed)
Addended by: Lucille Passy on: 01/28/2018 04:14 PM   Modules accepted: Orders

## 2018-01-28 NOTE — Telephone Encounter (Signed)
Patient wanted to know if her does of Protonix should be increased? Her reflux has been acting up. So she has been taking the rest of her Dexilant 60mg . Please Arnoldo Hooker

## 2018-01-28 NOTE — Telephone Encounter (Signed)
Is the dexilant more effective for her than the protonix?

## 2018-01-28 NOTE — Telephone Encounter (Signed)
Dexilant is more effective but her insurance will not cover it. She thinks the Protonix needs to be increased. Please advise

## 2018-01-29 MED ORDER — PROPRANOLOL HCL ER 60 MG PO CP24: 60.0000 mg | ORAL_CAPSULE | Freq: Every day | ORAL | 3 refills | Status: DC

## 2018-01-29 NOTE — Addendum Note (Signed)
Addended by: Verlene Mayer A on: 01/29/2018 10:42 AM   Modules accepted: Orders

## 2018-01-29 NOTE — Telephone Encounter (Signed)
Would continue the propranolol for now thanks

## 2018-01-29 NOTE — Addendum Note (Signed)
Addended by: Dollene Primrose on: 01/29/2018 12:02 PM   Modules accepted: Orders

## 2018-01-29 NOTE — Addendum Note (Signed)
Addended by: Dollene Primrose on: 01/29/2018 12:00 PM   Modules accepted: Orders

## 2018-01-29 NOTE — Telephone Encounter (Signed)
She discontinued zantac a month ago-updated her medications. She will take Protonix as directed. Verbalized understanding.

## 2018-01-29 NOTE — Telephone Encounter (Signed)
Pt would like to continue on Propanolol. Zebeta and Bystolic taken off pt's med list. Pt will call with any additional needs or questions.

## 2018-03-03 NOTE — Progress Notes (Addendum)
Cardiology Office Note Date:  03/05/2018  Patient ID:  Kristin Hinton, Kristin Hinton [birth year], MRN [id number] PCP:  Lucille Passy, MD  Cardiologist:  Dr. Oval Linsey Electrophysiologist; Dr. Caryl Comes   Chief Complaint: EP f/u  History of Present Illness: Kristin Hinton is a 72 y.o. female with history of HLD, Schatzki's ring, LBBB, SVT    She had been fairly well controlled with low dose BB for her SVT though in the last year increasing frequency.  Her BB uptitrated though intolerant with fatigue, unable to exercise.  Changed to dilt, though BP did not tolerate titration.   She saw Dr. Caryl Comes 11/2017.  She was not interested in ablation at that time.  She was referred for a ZIO Patch that revealed mostly sinus rhythms with isolated PACs and a nonsustained atrial tachycardia with aberrancy.  He gave her prescriptions of propranolol, nebivolol, and bisoprolol. At her f/u with Dr. Oval Linsey she was on propanolol and doing a bit better though still with daily fleeting palpitations, her propanolol up-titrated slightly to avoid hypotension with plans to c/w EP going forward.  Dr. Caryl Comes reported in his note [facility] 2017 as SVT.  Cover sheet was reviewed and described SVT at 200 bpm with some aberration as well as SVT at 135 with possible atrial tachycardia.  She comes in today to be seen for Dr. Caryl Comes since starting propanolol.    The patient comes accompanied by her husband.  She tells me she can not recall life without palpitations, these date back to her 89's.  Her fatigue goes back to then as well and has been attributed to her tachycardia.  She reports back in the beginning she would cough and it would stop, in the last several years this has been ineffective.  She has not tried any other vagal maneuvers.  She tried one of the other drug options that Dr. Caryl Comes gave her and it made her fatigue significantly worse, the other was not a covered drug for her.  She feels like the propanolol has perhaps  slowed the rate of her tachycardia but not the frequency of it.  Reports feeling her heart pounding out of her chest especially at night and most days feel like she has it.  She mentions as well episodes daily of feeling lightheaded, not near syncopal, but says if she were to be driving would pull over.  Unclear how long she has had these, but they are not associated with any awareness of her palpitations. No CP or SOB.  She does not sleep well for a couple reasons, often with palpitations, wakes to use the bathroom, and sometimes just trouble sleeping.  She mentioned last she spoke with dr. Caryl Comes they discussed ablation, and having her see D. Lovena Le but she was not quite ready, worries about procedures and risks.  Though feels like she may be getting to the point where she would like to hear more about the ablation option.  Past Medical History:  Diagnosis Date  . History of chicken pox   . Hyperlipidemia   . LBBB (left bundle branch block)   . Migraines   . Schatzki's ring   . SVT (supraventricular tachycardia) ([place])   . Tachycardia     Past Surgical History:  Procedure Laterality Date  . HYSTERECTOMY ABDOMINAL WITH SALPINGECTOMY  03/30/1991  . [place] SURGERY  03/19/1997  . MASTECTOMY Left    Due to surgical complications related to previous breast implant  . PLACEMENT OF BREAST IMPLANTS  03/19/2013  .  TONSILLECTOMY  03/20/1983    Current Outpatient Medications  Medication Sig Dispense Refill  . estradiol (CLIMARA - DOSED IN MG/24 HR) 0.075 mg/24hr patch Place 1 patch (0.075 mg total) onto the skin 2 (two) times a week. 24 patch 3  . fluticasone (FLONASE) 50 MCG/ACT nasal spray Place 2 sprays into both nostrils daily. 48 g 3  . pantoprazole (PROTONIX) 40 MG tablet Take 1 tablet (40 mg total) by mouth 2 (two) times daily before a meal. 60 tablet 1  . propranolol ER (INDERAL LA) 60 MG 24 hr capsule Take 1 capsule (60 mg total) by mouth daily. 90 capsule 3   No current  facility-administered medications for this visit.     Allergies:   Molds & smuts and Pollen extract   Social History:  The patient  reports that she has never smoked. She has quit using smokeless tobacco. She reports current alcohol use. She reports that she does not use drugs.   Family History:  The patient's family history includes Alcohol abuse in her brother and brother; Arthritis in her maternal grandmother, mother, and paternal grandmother; Breast cancer in her sister; Cancer in her brother and father; Congenital heart disease in her mother; Congestive Heart Failure in her mother; Drug abuse in her brother; Early death in her brother and brother; Hearing loss in her mother; Heart attack in her brother and paternal grandmother; Hypercholesterolemia in her maternal grandmother, paternal grandmother, and son; Hyperlipidemia in her mother; Hypertension in her maternal grandmother, mother, paternal grandmother, and son; Kidney disease in her mother; Stroke in her maternal grandmother.  ROS:  Please see the history of present illness.  All other systems are reviewed and otherwise negative.   PHYSICAL EXAM:  VS:  BP 122/66   Pulse 66   Ht 5\' 6"  (1.676 m)   Wt 120 lb (54.4 kg)   BMI 19.37 kg/m  BMI: Body mass index is 19.37 kg/m. Well nourished, well developed, in no acute distress, appears younger then her age 45: normocephalic, atraumatic  Neck: no JVD, carotid bruits or masses Cardiac:  RRR; no significant murmurs, no rubs, or gallops Lungs:  CTA b/l, no wheezing, rhonchi or rales  Abd: soft, nontender MS: no deformity or atrophy Ext: no edema  Skin: warm and dry, no rash Neuro:  No gross deficits appreciated Psych: euthymic mood, full affect   EKG:  Not done today  Ziopatch 09/27/17:  Duration: 14d Findings Symptoms  Racing Fluttering >> sinus mostly but not always with isolated PAC Symptom unspecified  Nonsustained atrial tach (wide with aberration) 8 bts >>150 bpm  No  symptoms atrial tach with variable conduction  AA interval about 400 msec with variable conduction   17 bts At night  Conclusions  Her daily symptoms of fluttering and racing is most consistent with Isolated PACs in sinus rhtyhm Some nonsustained atrial tachy-- brief and relatively slow- much less frequent    2017 Zio patch SR PVCs/PACs 113 runs of WCT, suspect SVT with rate related abberancy  Echo 07/21/15: LVEF 55%. Mild MR/TR. RVSP 25  Cardiac CT-A 04/28/15: Limited by artifact engaging Cardiac catheterization 08/01/15: Normal coronary arteries. LVEF 60%.  Recent Labs: 07/15/2017: ALT 19; BUN 7; Creatinine, Ser 0.84; Hemoglobin 12.5; Magnesium 2.2; Platelets 311; Potassium 4.9; Sodium 138; TSH 0.845  04/30/2017: Cholesterol 228; HDL 93; LDL Cholesterol 120; Triglycerides 76   CrCl cannot be calculated (Patient's most recent lab result is older than the maximum 21 days allowed.).   Wt Readings from Last  3 Encounters:  03/05/18 120 lb (54.4 kg)  12/20/17 117 lb (53.1 kg)  12/05/17 116 lb (52.6 kg)     Other studies reviewed: Additional studies/records reviewed today include: summarized above  ASSESSMENT AND PLAN:  1. Palpitations 2. SVT 3. PACs 4. Fatigue  We discussed her fatigue could certainly be multifactorial, poor sleep, betablocker, age, though her fatigue has been her whole life attributed to her tachycardia.  She has felt like her palpitations are just a part of her daily life and has been able to "put up with it" this long has been hesitant to want to pursue invasive approach though the fatigue and palpitations have started to become more bothersome to her, and wants to be able to enjoy and live life with better energy.  She has tried a number of medicines, none that have helped much all make her feel more fatigued  She is not certain that she is ready to sign up for an ablation, but would like to sit with Dr.Taylor and hear more about it.  We discussed that like Dr.  Caryl Comes mentioned, her problem is not life threatening.  That wether ablation is something that she would benefit from or not, could be discussed further with Dr. Lovena Le  She has daily symptoms of lightheaded spells (seems perhaps since she wore the zio patch) ? If bradycardia, will have her wear a 48hour monitor and f/u with Dr. Lovena Le  Disposition: as above  Current medicines are reviewed at length with the patient today.  The patient did not have any concerns regarding medicines.  Venetia Night, PA-C 03/05/2018 11:48 AM     [facility] [place] [address] [phone] (office)  [phone] (fax)

## 2018-03-05 ENCOUNTER — Ambulatory Visit (INDEPENDENT_AMBULATORY_CARE_PROVIDER_SITE_OTHER): Payer: Medicare Other | Admitting: Physician Assistant

## 2018-03-05 VITALS — BP 122/66 | HR 66 | Ht 66.0 in | Wt 120.0 lb

## 2018-03-05 DIAGNOSIS — R002 Palpitations: Secondary | ICD-10-CM | POA: Diagnosis not present

## 2018-03-05 DIAGNOSIS — R5382 Chronic fatigue, unspecified: Secondary | ICD-10-CM

## 2018-03-05 DIAGNOSIS — I491 Atrial premature depolarization: Secondary | ICD-10-CM | POA: Diagnosis not present

## 2018-03-05 DIAGNOSIS — I471 Supraventricular tachycardia: Secondary | ICD-10-CM | POA: Diagnosis not present

## 2018-03-05 NOTE — Patient Instructions (Signed)
Medication Instructions:   Your physician recommends that you continue on your current medications as directed. Please refer to the Current Medication list given to you today.   If you need a refill on your cardiac medications before your next appointment, please call your pharmacy.   Lab work: NONE ORDERED  TODAY  If you have labs (blood work) drawn today and your tests are completely normal, you will receive your results only by: Marland Kitchen MyChart Message (if you have MyChart) OR . A paper copy in the mail If you have any lab test that is abnormal or we need to change your treatment, we will call you to review the results.  Testing/Procedures: Your physician has recommended that you wear a holter monitor. Holter monitors are medical devices that record the heart's electrical activity. Doctors most often use these monitors to diagnose arrhythmias. Arrhythmias are problems with the speed or rhythm of the heartbeat. The monitor is a small, portable device. You can wear one while you do your normal daily activities. This is usually used to diagnose what is causing palpitations/syncope (passing out).   Follow-Up:  NEXT AVAILABLE WITH DR. Lovena Le NEW CONSULT FOR ABLATION     Any Other Special Instructions Will Be Listed Below (If Applicable).

## 2018-03-26 ENCOUNTER — Ambulatory Visit (INDEPENDENT_AMBULATORY_CARE_PROVIDER_SITE_OTHER): Payer: Medicare Other

## 2018-03-26 DIAGNOSIS — R002 Palpitations: Secondary | ICD-10-CM | POA: Diagnosis not present

## 2018-05-28 ENCOUNTER — Encounter: Payer: Self-pay | Admitting: Family Medicine

## 2018-05-28 ENCOUNTER — Ambulatory Visit (INDEPENDENT_AMBULATORY_CARE_PROVIDER_SITE_OTHER): Payer: Medicare Other | Admitting: Family Medicine

## 2018-05-28 ENCOUNTER — Other Ambulatory Visit: Payer: Self-pay

## 2018-05-28 VITALS — BP 116/62 | HR 58 | Temp 98.4°F | Ht 64.96 in | Wt 119.0 lb

## 2018-05-28 DIAGNOSIS — K222 Esophageal obstruction: Secondary | ICD-10-CM | POA: Diagnosis not present

## 2018-05-28 DIAGNOSIS — I471 Supraventricular tachycardia: Secondary | ICD-10-CM | POA: Diagnosis not present

## 2018-05-28 DIAGNOSIS — R21 Rash and other nonspecific skin eruption: Secondary | ICD-10-CM

## 2018-05-28 DIAGNOSIS — Z1159 Encounter for screening for other viral diseases: Secondary | ICD-10-CM | POA: Diagnosis not present

## 2018-05-28 DIAGNOSIS — I447 Left bundle-branch block, unspecified: Secondary | ICD-10-CM | POA: Diagnosis not present

## 2018-05-28 DIAGNOSIS — I159 Secondary hypertension, unspecified: Secondary | ICD-10-CM

## 2018-05-28 DIAGNOSIS — G4700 Insomnia, unspecified: Secondary | ICD-10-CM | POA: Diagnosis not present

## 2018-05-28 DIAGNOSIS — I10 Essential (primary) hypertension: Secondary | ICD-10-CM | POA: Insufficient documentation

## 2018-05-28 LAB — LIPID PANEL
CHOLESTEROL: 205 mg/dL — AB (ref 0–200)
HDL: 85.2 mg/dL (ref >39.00)
LDL Cholesterol: 100 mg/dL — ABNORMAL HIGH (ref 0–99)
NonHDL: 119.71
Total CHOL/HDL Ratio: 2
Triglycerides: 98 mg/dL (ref 0.0–149.0)
VLDL: 19.6 mg/dL (ref 0.0–40.0)

## 2018-05-28 LAB — CBC
HCT: 37.2 % (ref 36.0–46.0)
HEMOGLOBIN: 12.6 g/dL (ref 12.0–15.0)
MCHC: 33.9 g/dL (ref 30.0–36.0)
MCV: 95 fl (ref 78.0–100.0)
Platelets: 286 10*3/uL (ref 150.0–400.0)
RBC: 3.91 Mil/uL (ref 3.87–5.11)
RDW: 13.9 % (ref 11.5–15.5)
WBC: 5 10*3/uL (ref 4.0–10.5)

## 2018-05-28 LAB — COMPREHENSIVE METABOLIC PANEL
ALT: 17 U/L (ref 0–35)
AST: 25 U/L (ref 0–37)
Albumin: 4.5 g/dL (ref 3.5–5.2)
Alkaline Phosphatase: 51 U/L (ref 39–117)
BUN: 17 mg/dL (ref 6–23)
CO2: 29 mEq/L (ref 19–32)
Calcium: 9.6 mg/dL (ref 8.4–10.5)
Chloride: 101 mEq/L (ref 96–112)
Creatinine, Ser: 0.89 mg/dL (ref 0.40–1.20)
GFR: 62.31 mL/min (ref >60.00)
Glucose, Bld: 90 mg/dL (ref 70–99)
POTASSIUM: 4.6 meq/L (ref 3.5–5.1)
Sodium: 136 mEq/L (ref 135–145)
Total Bilirubin: 0.6 mg/dL (ref 0.2–1.2)
Total Protein: 6.9 g/dL (ref 6.0–8.3)

## 2018-05-28 LAB — TSH: TSH: 1.27 u[IU]/mL (ref 0.35–4.50)

## 2018-05-28 MED ORDER — TRIAMCINOLONE ACETONIDE 0.1 % EX CREA: 1.0000 "application " | TOPICAL_CREAM | Freq: Two times a day (BID) | CUTANEOUS | 0 refills | Status: AC

## 2018-05-28 NOTE — Assessment & Plan Note (Signed)
Well controlled. No changes made today. 

## 2018-05-28 NOTE — Assessment & Plan Note (Signed)
  The problem of recurrent insomnia is discussed. Avoidance of caffeine sources is strongly encouraged. Sleep hygiene issues are reviewed. She will try to increase melatonin to 3 mg night and keep me updated.

## 2018-05-28 NOTE — Patient Instructions (Addendum)
Great to see you. I will call you with your lab results from today and you can view them online.   Please schedule a medicare wellness visit with Glenard Haring on your way out.  Try triamcinolone twice daily for no longer than a week without letting me know. Also could try aloe.

## 2018-05-28 NOTE — Assessment & Plan Note (Signed)
New- looks like a chemical burn. Advised aloe, triamcinolone eRx sent given that it is itchy. She will update me in 1 week. The patient indicates understanding of these issues and agrees with the plan.

## 2018-05-28 NOTE — Progress Notes (Signed)
Subjective:   Patient ID: Kristin Hinton, female    DOB: 1946/02/08, 73 y.o.   MRN: [id number]  Kristin Hinton is a pleasant 73 y.o. year old female who presents to clinic today with Follow-up (med hx)  on 05/28/2018  HPI:  Here for follow up.  Has not yet had her wellness visit with Glenard Haring, Therapist, sports.  GERD- has been well controlled with Dexilant. H/o Schatzki's ring- s/p esophageal dilatation a couple of years ago.  Symptoms have been well controlled.  SVT- followed by cardiology, Dr. Oval Linsey.  Saw her yesterday. Last seen by cardiology on 03/05/18. Note reviewed.  She has been considering ablation with Dr. Caryl Comes. Palpitations are getting better.   Rash- irritated and swollen skin on forehead and skin since she saw her aesthetician for chemical facial treatment. No fevers.  She has been using aquafor- she did contact their office.   Lab Results  Component Value Date   CHOL 228 (A) 04/30/2017   HDL 93 (A) 04/30/2017   LDLCALC 120 04/30/2017   TRIG 76 04/30/2017   CHOLHDL 2.2 07/11/2016   Lab Results  Component Value Date   TSH 0.845 07/15/2017   Lab Results  Component Value Date   NA 138 07/15/2017   K 4.9 07/15/2017   CL 99 07/15/2017   CO2 23 07/15/2017   Lab Results  Component Value Date   CREATININE 0.84 07/15/2017   Lab Results  Component Value Date   WBC 4.7 07/15/2017   HGB 12.5 07/15/2017   HCT 38.1 07/15/2017   MCV 95 07/15/2017   PLT 311 07/15/2017   Insomnia- past 6 months.  No difficulty falling asleep but she is waking up at 3 am and can't fall back to sleep. Melatonin 1 mg nightly has helped a little.  Denies feeling depressed or anxious.   Current Outpatient Medications on File Prior to Visit  Medication Sig Dispense Refill  . estradiol (CLIMARA - DOSED IN MG/24 HR) 0.075 mg/24hr patch Place 1 patch (0.075 mg total) onto the skin 2 (two) times a week. 24 patch 3  . fluticasone (FLONASE) 50 MCG/ACT nasal spray Place 2 sprays into both nostrils  daily. 48 g 3  . pantoprazole (PROTONIX) 40 MG tablet Take 1 tablet (40 mg total) by mouth 2 (two) times daily before a meal. 60 tablet 1  . propranolol ER (INDERAL LA) 60 MG 24 hr capsule Take 1 capsule (60 mg total) by mouth daily. 90 capsule 3   No current facility-administered medications on file prior to visit.     Allergies  Allergen Reactions  . Molds & Smuts Itching  . Pollen Extract Itching    Past Medical History:  Diagnosis Date  . History of chicken pox   . Hyperlipidemia   . LBBB (left bundle branch block)   . Migraines   . Schatzki's ring   . SVT (supraventricular tachycardia) ([place])   . Tachycardia     Past Surgical History:  Procedure Laterality Date  . HYSTERECTOMY ABDOMINAL WITH SALPINGECTOMY  03/30/1991  . [place] SURGERY  03/19/1997  . MASTECTOMY Left    Due to surgical complications related to previous breast implant  . PLACEMENT OF BREAST IMPLANTS  03/19/2013  . TONSILLECTOMY  03/20/1983    Family History  Problem Relation Age of Onset  . Congenital heart disease Mother   . Congestive Heart Failure Mother   . Hyperlipidemia Mother   . Hypertension Mother   . Kidney disease Mother   . Arthritis Mother   .  Hearing loss Mother   . Cancer Father        Lung  . Breast cancer Sister   . Cancer Brother   . Alcohol abuse Brother   . Drug abuse Brother   . Early death Brother   . Heart attack Brother   . Stroke Maternal Grandmother   . Arthritis Maternal Grandmother   . Hypercholesterolemia Maternal Grandmother   . Hypertension Maternal Grandmother   . Arthritis Paternal Grandmother   . Heart attack Paternal Grandmother   . Hypercholesterolemia Paternal Grandmother   . Hypertension Paternal Grandmother   . Alcohol abuse Brother   . Early death Brother   . Hypercholesterolemia Son   . Hypertension Son     Social History   Socioeconomic History  . Marital status: Married    Spouse name: Not on file  . Number of children: Not on file   . Years of education: Not on file  . Highest education level: Not on file  Occupational History  . Not on file  Social Needs  . Financial resource strain: Not on file  . Food insecurity:    Worry: Not on file    Inability: Not on file  . Transportation needs:    Medical: Not on file    Non-medical: Not on file  Tobacco Use  . Smoking status: Never Smoker  . Smokeless tobacco: Former Network engineer and Sexual Activity  . Alcohol use: Yes    Comment: 7-10 drinks per week  . Drug use: Never  . Sexual activity: Not on file  Lifestyle  . Physical activity:    Days per week: Not on file    Minutes per session: Not on file  . Stress: Not on file  Relationships  . Social connections:    Talks on phone: Not on file    Gets together: Not on file    Attends religious service: Not on file    Active member of club or organization: Not on file    Attends meetings of clubs or organizations: Not on file    Relationship status: Not on file  . Intimate partner violence:    Fear of current or ex partner: Not on file    Emotionally abused: Not on file    Physically abused: Not on file    Forced sexual activity: Not on file  Other Topics Concern  . Not on file  Social History Narrative  . Not on file   The PMH, PSH, Social History, Family History, Medications, and allergies have been reviewed in [facility], and have been updated if relevant.   Review of Systems  Constitutional: Negative.   HENT: Negative.   Eyes: Negative.   Respiratory: Negative.   Cardiovascular: Negative.   Gastrointestinal: Negative.   Endocrine: Negative.   Genitourinary: Negative.   Musculoskeletal: Negative for arthralgias, back pain, gait problem, joint swelling, myalgias, neck pain and neck stiffness.  Skin: Positive for rash.  Allergic/Immunologic: Negative.   Neurological: Negative.   Hematological: Negative.   Psychiatric/Behavioral: Positive for sleep disturbance. Negative for agitation, behavioral  problems, confusion, decreased concentration, dysphoric mood, hallucinations, self-injury and suicidal ideas. The patient is not nervous/anxious and is not hyperactive.   All other systems reviewed and are negative.      Objective:    BP 116/62   Pulse (!) 58   Temp 98.4 F (36.9 C) (Oral)   Ht 5' 4.96" (1.65 m)   Wt 119 lb (54 kg)   SpO2 99%  BMI 19.83 kg/m    Physical Exam Vitals signs and nursing note reviewed.  Constitutional:      General: She is not in acute distress.    Appearance: She is well-developed. She is not diaphoretic.  HENT:     Head: Normocephalic and atraumatic.  Cardiovascular:     Rate and Rhythm: Normal rate.  Pulmonary:     Effort: Pulmonary effort is normal.  Musculoskeletal:     Cervical back: She exhibits normal range of motion, no tenderness, no bony tenderness, no swelling, no edema, no deformity, no laceration, no pain, no spasm and normal pulse.  Skin:    General: Skin is warm and dry.     Findings: Rash present.     Comments: Redness and swelling on forehead and chind  Neurological:     Mental Status: She is alert and oriented to person, place, and time.     Cranial Nerves: No cranial nerve deficit.     Coordination: Coordination normal.  Psychiatric:        Behavior: Behavior normal.        Thought Content: Thought content normal.        Judgment: Judgment normal.           Assessment & Plan:   LBBB (left bundle branch block)  SVT (supraventricular tachycardia) ([place]) - Plan: TSH  Schatzki's ring  Secondary hypertension - Plan: Comprehensive metabolic panel, Lipid panel, CBC  Insomnia, unspecified type  Need for hepatitis C screening test - Plan: Hepatitis C Antibody  Rash and other nonspecific skin eruption No follow-ups on file.

## 2018-05-28 NOTE — Assessment & Plan Note (Signed)
Followed by cardiology. Palpitations have been better. No changes made today.

## 2018-05-29 LAB — HEPATITIS C ANTIBODY
Hepatitis C Ab: NONREACTIVE
SIGNAL TO CUT-OFF: 0.01 (ref <1.00)

## 2018-06-05 ENCOUNTER — Encounter: Payer: Self-pay | Admitting: Family Medicine

## 2018-06-21 ENCOUNTER — Encounter: Payer: Self-pay | Admitting: Family Medicine

## 2018-06-23 NOTE — Telephone Encounter (Signed)
Called pt this morning. Explained that AWV would need to be via [facility]  And could not be done strictly by phone. Pt declined and requested a later visit appt.

## 2018-06-25 ENCOUNTER — Ambulatory Visit: Payer: Medicare Other | Admitting: *Deleted

## 2018-07-22 ENCOUNTER — Telehealth: Payer: Self-pay | Admitting: Family Medicine

## 2018-07-22 MED ORDER — ESTRADIOL 0.075 MG/24HR TD PTWK: 0.0750 mg | MEDICATED_PATCH | TRANSDERMAL | 3 refills | Status: DC

## 2018-07-22 NOTE — Telephone Encounter (Signed)
Copied from [place] [phone]. Topic: Quick Communication - Rx Refill/Question >> Jul 22, 2018  9:11 AM Sheppard Coil, [employer] wrote: Medication:  estradiol (CLIMARA - DOSED IN MG/24 HR) 0.075 mg/24hr patch  Per pt her pharmacy has told her this is on back order and pt is on her last 2 week supply.  Pt was told to ask PCP to call this into another pharmacy as a 90 day supply to help her out.  Has the patient contacted their pharmacy? yes (Agent: If no, request that the patient contact the pharmacy for the refill.) (Agent: If yes, when and what did the pharmacy advise?)  Preferred Pharmacy (with phone number or street name): [id number] - [place], [address] AT [phone] (Phone) [phone] (Fax)  Agent: Please be advised that RX refills may take up to 3 business days. We ask that you follow-up with your pharmacy.

## 2018-07-23 ENCOUNTER — Other Ambulatory Visit: Payer: Self-pay

## 2018-07-23 MED ORDER — ESTRADIOL 0.075 MG/24HR TD PTWK: 0.0750 mg | MEDICATED_PATCH | TRANSDERMAL | 3 refills | Status: DC

## 2018-07-24 ENCOUNTER — Other Ambulatory Visit: Payer: Self-pay

## 2018-07-24 MED ORDER — ESTRADIOL 0.075 MG/24HR TD PTWK: 0.0750 mg | MEDICATED_PATCH | TRANSDERMAL | 1 refills | Status: DC

## 2018-08-25 DIAGNOSIS — H2513 Age-related nuclear cataract, bilateral: Secondary | ICD-10-CM | POA: Diagnosis not present

## 2018-09-02 NOTE — Progress Notes (Signed)
Virtual Visit via Video Note  I connected with patient on 09/03/18 at 10:00 AM EDT by audio enabled telemedicine application and verified that I am speaking with the correct person using two identifiers.   THIS ENCOUNTER IS A VIRTUAL VISIT DUE TO COVID-19 - PATIENT WAS NOT SEEN IN THE OFFICE. PATIENT HAS CONSENTED TO VIRTUAL VISIT / TELEMEDICINE VISIT   Location of patient: home  Location of provider: office  I discussed the limitations of evaluation and management by telemedicine and the availability of in person appointments. The patient expressed understanding and agreed to proceed.   Subjective:   Kristin Hinton is a 73 y.o. female who presents for an Initial Medicare Annual Wellness Visit.  The Patient was informed that the wellness visit is to identify future health risk and educate and initiate measures that can reduce risk for increased disease through the lifespan.   Describes health as fair, good or great? "Really good"  Review of Systems   No ROS.  Medicare Wellness Virtual Visit.  Visual/audio telehealth visit, UTA vital signs.   See social history for additional risk factors. Cardiac Risk Factors include: advanced age (>19men, >74 women);hypertension Sleep patterns: no issues Home Safety/Smoke Alarms: Feels safe in home. Smoke alarms in place.  Lives with husband in 2 story home. No issues with stairs.   Female:     Mammo-per pt in [facility] 2019  Per pt.       Dexa scan- pt reports last done in [place] 2-54yrs      CCS- pt reports 2018 in [place]. 10 yr recall per pt. Eye- Dr.Groat yearly. UTD per pt.    Objective:    Advanced Directives 09/03/2018  Does Patient Have a Medical Advance Directive? Yes  Type of Advance Directive Living will  Does patient want to make changes to medical advance directive? No - Patient declined    Current Medications (verified) Outpatient Encounter Medications as of 09/03/2018  Medication Sig  . estradiol (CLIMARA - DOSED IN MG/24  HR) 0.075 mg/24hr patch Place 1 patch (0.075 mg total) onto the skin 2 (two) times a week.  . fluticasone (FLONASE) 50 MCG/ACT nasal spray Place 2 sprays into both nostrils daily.  . pantoprazole (PROTONIX) 40 MG tablet Take 1 tablet (40 mg total) by mouth 2 (two) times daily before a meal.  . propranolol ER (INDERAL LA) 60 MG 24 hr capsule Take 1 capsule (60 mg total) by mouth daily.  Marland Kitchen triamcinolone cream (KENALOG) 0.1 % Apply 1 application topically 2 (two) times daily. (Patient not taking: Reported on 09/03/2018)   No facility-administered encounter medications on file as of 09/03/2018.     Allergies (verified) Molds & smuts and Pollen extract   History: Past Medical History:  Diagnosis Date  . History of chicken pox   . Hyperlipidemia   . LBBB (left bundle branch block)   . Migraines   . Schatzki's ring   . SVT (supraventricular tachycardia) ([place])   . Tachycardia    Past Surgical History:  Procedure Laterality Date  . HYSTERECTOMY ABDOMINAL WITH SALPINGECTOMY  03/30/1991  . [place] SURGERY  03/19/1997  . MASTECTOMY Left    Due to surgical complications related to previous breast implant  . PLACEMENT OF BREAST IMPLANTS  03/19/2013  . TONSILLECTOMY  03/20/1983   Family History  Problem Relation Age of Onset  . Congenital heart disease Mother   . Congestive Heart Failure Mother   . Hyperlipidemia Mother   . Hypertension Mother   .  Kidney disease Mother   . Arthritis Mother   . Hearing loss Mother   . Cancer Father        Lung  . Breast cancer Sister   . Cancer Brother   . Alcohol abuse Brother   . Drug abuse Brother   . Early death Brother   . Heart attack Brother   . Stroke Maternal Grandmother   . Arthritis Maternal Grandmother   . Hypercholesterolemia Maternal Grandmother   . Hypertension Maternal Grandmother   . Arthritis Paternal Grandmother   . Heart attack Paternal Grandmother   . Hypercholesterolemia Paternal Grandmother   . Hypertension Paternal  Grandmother   . Alcohol abuse Brother   . Early death Brother   . Hypercholesterolemia Son   . Hypertension Son    Social History   Socioeconomic History  . Marital status: Married    Spouse name: Not on file  . Number of children: Not on file  . Years of education: Not on file  . Highest education level: Not on file  Occupational History  . Not on file  Social Needs  . Financial resource strain: Not on file  . Food insecurity    Worry: Not on file    Inability: Not on file  . Transportation needs    Medical: Not on file    Non-medical: Not on file  Tobacco Use  . Smoking status: Never Smoker  . Smokeless tobacco: Former Network engineer and Sexual Activity  . Alcohol use: Yes    Comment: 7-10 drinks per week  . Drug use: Never  . Sexual activity: Not on file  Lifestyle  . Physical activity    Days per week: Not on file    Minutes per session: Not on file  . Stress: Not on file  Relationships  . Social Herbalist on phone: Not on file    Gets together: Not on file    Attends religious service: Not on file    Active member of club or organization: Not on file    Attends meetings of clubs or organizations: Not on file    Relationship status: Not on file  Other Topics Concern  . Not on file  Social History Narrative  . Not on file    Tobacco Counseling Counseling given: Not Answered   Clinical Intake: Pain : No/denies pain    Activities of Daily Living In your present state of health, do you have any difficulty performing the following activities: 09/03/2018  Hearing? N  Vision? N  Comment Wears glasses for reading.  Difficulty concentrating or making decisions? N  Walking or climbing stairs? N  Dressing or bathing? N  Doing errands, shopping? N  Preparing Food and eating ? N  Using the Toilet? N  In the past six months, have you accidently leaked urine? N  Do you have problems with loss of bowel control? N  Managing your Medications? N   Managing your Finances? N  Housekeeping or managing your Housekeeping? N  Some recent data might be hidden     Immunizations and Health Maintenance Immunization History  Administered Date(s) Administered  . H1N1 01/22/2008  . Influenza, High Dose Seasonal PF 02/16/2005, 02/21/2006, 01/09/2007, 11/27/2007, 01/22/2008, 11/15/2008, 11/09/2009, 12/30/2010, 12/22/2012, 12/23/2013, 01/07/2015, 12/19/2015, 12/25/2016, 12/05/2017  . Pneumococcal Conjugate-13 04/27/2013  . Pneumococcal Polysaccharide-23 06/25/2011  . Tdap 11/09/2009, 07/18/2010  . Zoster 10/22/2007  . Zoster Recombinat (Shingrix) 01/30/2017   There are no preventive care reminders to display  for this patient.  Patient Care Team: Lucille Passy, MD as PCP - General (Family Medicine)  Indicate any recent Medical Services you may have received from other than [facility] providers in the past year (date may be approximate).     Assessment:   This is a routine wellness examination for Yu. Physical assessment deferred to PCP.  Hearing/Vision screen Unable to assess. This visit is enabled though telemedicine due to Covid 19.  Dietary issues and exercise activities discussed: Current Exercise Habits: Home exercise routine, Type of exercise: strength training/weights;yoga;walking, Time (Minutes): 45, Frequency (Times/Week): 7, Weekly Exercise (Minutes/Week): 315, Intensity: Mild, Exercise limited by: None identified   Diet (meal preparation, eat out, water intake, caffeinated beverages, dairy products, fruits and vegetables): in general, a "healthy" diet  , well balanced, on average, 3 meals per day   Goals    . Maintain healthy active lifestyle.      Depression Screen PHQ 2/9 Scores 09/03/2018 05/28/2018 07/16/2017  PHQ - 2 Score 0 0 0    Fall Risk Fall Risk  09/03/2018 05/28/2018 07/16/2017  Falls in the past year? 0 0 No    Cognitive Function: Ad8 score reviewed for issues:  Issues making decisions:no  Less interest  in hobbies / activities:no  Repeats questions, stories (family complaining):no  Trouble using ordinary gadgets (microwave, computer, phone):no  Forgets the month or year: no  Mismanaging finances: no  Remembering appts:no  Daily problems with thinking and/or memory:no Ad8 score is=0         Screening Tests Health Maintenance  Topic Date Due  . INFLUENZA VACCINE  10/18/2018  . MAMMOGRAM  11/06/2018  . TETANUS/TDAP  07/17/2020  . COLONOSCOPY  03/24/2023  . DEXA SCAN  Completed  . Hepatitis C Screening  Completed  . PNA vac Low Risk Adult  Completed        Plan:   See you next year!  Continue to eat heart healthy diet (full of fruits, vegetables, whole grains, lean protein, water--limit salt, fat, and sugar intake) and increase physical activity as tolerated.  Continue doing brain stimulating activities (puzzles, reading, adult coloring books, staying active) to keep memory sharp.   Bring a copy of your living will and/or healthcare power of attorney to your next office visit.   I have personally reviewed and noted the following in the patient's chart:   . Medical and social history . Use of alcohol, tobacco or illicit drugs  . Current medications and supplements . Functional ability and status . Nutritional status . Physical activity . Advanced directives . List of other physicians . Hospitalizations, surgeries, and ER visits in previous 12 months . Vitals . Screenings to include cognitive, depression, and falls . Referrals and appointments  In addition, I have reviewed and discussed with patient certain preventive protocols, quality metrics, and best practice recommendations. A written personalized care plan for preventive services as well as general preventive health recommendations were provided to patient.     Shela Nevin, [place]   09/03/2018

## 2018-09-03 ENCOUNTER — Ambulatory Visit (INDEPENDENT_AMBULATORY_CARE_PROVIDER_SITE_OTHER): Payer: Medicare Other | Admitting: *Deleted

## 2018-09-03 ENCOUNTER — Encounter: Payer: Self-pay | Admitting: *Deleted

## 2018-09-03 ENCOUNTER — Other Ambulatory Visit: Payer: Self-pay

## 2018-09-03 DIAGNOSIS — Z0000 Encounter for general adult medical examination without abnormal findings: Secondary | ICD-10-CM

## 2018-09-03 MED ORDER — PANTOPRAZOLE SODIUM 40 MG PO TBEC: 40.0000 mg | DELAYED_RELEASE_TABLET | Freq: Two times a day (BID) | ORAL | 3 refills | Status: DC

## 2018-09-03 NOTE — Patient Instructions (Addendum)
See you next year!  Continue to eat heart healthy diet (full of fruits, vegetables, whole grains, lean protein, water--limit salt, fat, and sugar intake) and increase physical activity as tolerated.  Continue doing brain stimulating activities (puzzles, reading, adult coloring books, staying active) to keep memory sharp.   Bring a copy of your living will and/or healthcare power of attorney to your next office visit.   Ms. Kristin Hinton , Thank you for taking time to come for your Medicare Wellness Visit. I appreciate your ongoing commitment to your health goals. Please review the following plan we discussed and let me know if I can assist you in the future.   These are the goals we discussed: Goals    . Maintain healthy active lifestyle.       This is a list of the screening recommended for you and due dates:  Health Maintenance  Topic Date Due  . Flu Shot  10/18/2018  . Mammogram  11/06/2018  . Tetanus Vaccine  07/17/2020  . Colon Cancer Screening  03/24/2023  . DEXA scan (bone density measurement)  Completed  .  Hepatitis C: One time screening is recommended by Center for Disease Control  (CDC) for  adults born from [birth year] through 1965.   Completed  . Pneumonia vaccines  Completed    Health Maintenance After Age 40 After age 43, you are at a higher risk for certain long-term diseases and infections as well as injuries from falls. Falls are a major cause of broken bones and head injuries in people who are older than age 37. Getting regular preventive care can help to keep you healthy and well. Preventive care includes getting regular testing and making lifestyle changes as recommended by your health care provider. Talk with your health care provider about:  Which screenings and tests you should have. A screening is a test that checks for a disease when you have no symptoms.  A diet and exercise plan that is right for you. What should I know about screenings and tests to prevent  falls? Screening and testing are the best ways to find a health problem early. Early diagnosis and treatment give you the best chance of managing medical conditions that are common after age 54. Certain conditions and lifestyle choices may make you more likely to have a fall. Your health care provider may recommend:  Regular vision checks. Poor vision and conditions such as cataracts can make you more likely to have a fall. If you wear glasses, make sure to get your prescription updated if your vision changes.  Medicine review. Work with your health care provider to regularly review all of the medicines you are taking, including over-the-counter medicines. Ask your health care provider about any side effects that may make you more likely to have a fall. Tell your health care provider if any medicines that you take make you feel dizzy or sleepy.  Osteoporosis screening. Osteoporosis is a condition that causes the bones to get weaker. This can make the bones weak and cause them to break more easily.  Blood pressure screening. Blood pressure changes and medicines to control blood pressure can make you feel dizzy.  Strength and balance checks. Your health care provider may recommend certain tests to check your strength and balance while standing, walking, or changing positions.  Foot health exam. Foot pain and numbness, as well as not wearing proper footwear, can make you more likely to have a fall.  Depression screening. You may be more likely to  have a fall if you have a fear of falling, feel emotionally low, or feel unable to do activities that you used to do.  Alcohol use screening. Using too much alcohol can affect your balance and may make you more likely to have a fall. What actions can I take to lower my risk of falls? General instructions  Talk with your health care provider about your risks for falling. Tell your health care provider if: ? You fall. Be sure to tell your health care  provider about all falls, even ones that seem minor. ? You feel dizzy, sleepy, or off-balance.  Take over-the-counter and prescription medicines only as told by your health care provider. These include any supplements.  Eat a healthy diet and maintain a healthy weight. A healthy diet includes low-fat dairy products, low-fat (lean) meats, and fiber from whole grains, beans, and lots of fruits and vegetables. Home safety  Remove any tripping hazards, such as rugs, cords, and clutter.  Install safety equipment such as grab bars in bathrooms and safety rails on stairs.  Keep rooms and walkways well-lit. Activity   Follow a regular exercise program to stay fit. This will help you maintain your balance. Ask your health care provider what types of exercise are appropriate for you.  If you need a cane or walker, use it as recommended by your health care provider.  Wear supportive shoes that have nonskid soles. Lifestyle  Do not drink alcohol if your health care provider tells you not to drink.  If you drink alcohol, limit how much you have: ? 0-1 drink a day for women. ? 0-2 drinks a day for men.  Be aware of how much alcohol is in your drink. In the [place] one drink equals one typical bottle of beer (12 oz), one-half glass of wine (5 oz), or one shot of hard liquor (1 oz).  Do not use any products that contain nicotine or tobacco, such as cigarettes and e-cigarettes. If you need help quitting, ask your health care provider. Summary  Having a healthy lifestyle and getting preventive care can help to protect your health and wellness after age 30.  Screening and testing are the best way to find a health problem early and help you avoid having a fall. Early diagnosis and treatment give you the best chance for managing medical conditions that are more common for people who are older than age 70.  Falls are a major cause of broken bones and head injuries in people who are older than age 42.  Take precautions to prevent a fall at home.  Work with your health care provider to learn what changes you can make to improve your health and wellness and to prevent falls. This information is not intended to replace advice given to you by your health care provider. Make sure you discuss any questions you have with your health care provider. Document Released: 01/16/2017 Document Revised: 01/16/2017 Document Reviewed: 01/16/2017 Elsevier Interactive Patient Education  2019 [employer].

## 2018-09-03 NOTE — Progress Notes (Signed)
I reviewed health advisor's note, was available for consultation, and agree with documentation and plan.  

## 2018-09-04 ENCOUNTER — Encounter: Payer: Self-pay | Admitting: Family Medicine

## 2018-09-17 ENCOUNTER — Telehealth: Payer: Self-pay

## 2018-09-17 DIAGNOSIS — Z20822 Contact with and (suspected) exposure to covid-19: Secondary | ICD-10-CM

## 2018-09-17 NOTE — Telephone Encounter (Signed)
Scheduled patient for COVID 19 test 09/18/2018 at 2:30 pm at [facility].  Testing protocol reviewed.

## 2018-09-17 NOTE — Telephone Encounter (Signed)
[facility]-Patient is high risk and Dr. Deborra Medina would like for her to be contacted to administer the COVID-19 test please/thx dmf

## 2018-09-18 ENCOUNTER — Other Ambulatory Visit: Payer: Medicare Other

## 2018-09-18 DIAGNOSIS — Z20822 Contact with and (suspected) exposure to covid-19: Secondary | ICD-10-CM

## 2018-09-18 DIAGNOSIS — R6889 Other general symptoms and signs: Secondary | ICD-10-CM | POA: Diagnosis not present

## 2018-09-24 LAB — NOVEL CORONAVIRUS, NAA: SARS-CoV-2, NAA: NOT DETECTED

## 2018-10-17 ENCOUNTER — Other Ambulatory Visit: Payer: Self-pay | Admitting: Family Medicine

## 2018-10-24 ENCOUNTER — Telehealth: Payer: Self-pay | Admitting: Family Medicine

## 2018-10-24 MED ORDER — ESTRADIOL 0.075 MG/24HR TD PTWK: 0.0750 mg | MEDICATED_PATCH | TRANSDERMAL | 3 refills | Status: DC

## 2018-10-24 NOTE — Telephone Encounter (Signed)
Medication Refill - Medication: estradiol (CLIMARA - DOSED IN MG/24 HR) 0.075 mg/24hr patch / 90 day supply  Has the patient contacted their pharmacy? Yes.   (Agent: If no, request that the patient contact the pharmacy for the refill.) (Agent: If yes, when and what did the pharmacy advise?)  Preferred Pharmacy (with phone number or street name):  [facility], [place] [phone] (Phone) [phone] (Fax)     Agent: Please be advised that RX refills may take up to 3 business days. We ask that you follow-up with your pharmacy.

## 2018-10-24 NOTE — Telephone Encounter (Signed)
Sent to [facility]/thx dmf

## 2018-10-30 ENCOUNTER — Telehealth: Payer: Self-pay | Admitting: Family Medicine

## 2018-10-30 NOTE — Telephone Encounter (Signed)
Kristin Hinton with [facility] called and would like to speak with someone about patients medication estradiol (CLIMARA - DOSED IN MG/24 HR) 0.075 mg/24hr patch and clarification on the instruction. Please call her back and refer# is [id number].

## 2018-11-04 MED ORDER — ESTRADIOL 0.075 MG/24HR TD PTWK: 0.0750 mg | MEDICATED_PATCH | TRANSDERMAL | 3 refills | Status: DC

## 2018-11-06 NOTE — Telephone Encounter (Signed)
I resent Rx/thx dmf

## 2018-11-20 DIAGNOSIS — Z1231 Encounter for screening mammogram for malignant neoplasm of breast: Secondary | ICD-10-CM | POA: Diagnosis not present

## 2018-11-20 LAB — HM MAMMOGRAPHY

## 2018-12-03 ENCOUNTER — Encounter: Payer: Self-pay | Admitting: Family Medicine

## 2018-12-24 DIAGNOSIS — Z23 Encounter for immunization: Secondary | ICD-10-CM | POA: Diagnosis not present

## 2019-01-06 ENCOUNTER — Other Ambulatory Visit: Payer: Self-pay | Admitting: Internal Medicine

## 2019-02-02 ENCOUNTER — Other Ambulatory Visit: Payer: Self-pay

## 2019-02-02 ENCOUNTER — Encounter: Payer: Self-pay | Admitting: Family Medicine

## 2019-02-02 ENCOUNTER — Ambulatory Visit (INDEPENDENT_AMBULATORY_CARE_PROVIDER_SITE_OTHER): Payer: Medicare Other | Admitting: Family Medicine

## 2019-02-02 VITALS — BP 120/68 | HR 66 | Temp 97.2°F | Ht 64.96 in | Wt 119.8 lb

## 2019-02-02 DIAGNOSIS — E559 Vitamin D deficiency, unspecified: Secondary | ICD-10-CM | POA: Diagnosis not present

## 2019-02-02 DIAGNOSIS — G5761 Lesion of plantar nerve, right lower limb: Secondary | ICD-10-CM

## 2019-02-02 DIAGNOSIS — M2550 Pain in unspecified joint: Secondary | ICD-10-CM

## 2019-02-02 DIAGNOSIS — M25571 Pain in right ankle and joints of right foot: Secondary | ICD-10-CM | POA: Diagnosis not present

## 2019-02-02 DIAGNOSIS — M1990 Unspecified osteoarthritis, unspecified site: Secondary | ICD-10-CM | POA: Insufficient documentation

## 2019-02-02 LAB — VITAMIN D 25 HYDROXY (VIT D DEFICIENCY, FRACTURES): VITD: 44.46 ng/mL (ref 30.00–100.00)

## 2019-02-02 LAB — SEDIMENTATION RATE: Sed Rate: 7 mm/hr (ref 0–30)

## 2019-02-02 LAB — C-REACTIVE PROTEIN: CRP: <1 mg/dL (ref 0.5–20.0)

## 2019-02-02 NOTE — Patient Instructions (Signed)

## 2019-02-02 NOTE — Assessment & Plan Note (Signed)
>  25 minutes spent in face to face time with patient, >50% spent in counselling or coordination of care - probable OA but will check additional labs to rule out other contributing conditions. The patient indicates understanding of these issues and agrees with the plan. Orders Placed This Encounter  Procedures  . Rheumatoid Factor  . Sedimentation rate  . Antinuclear Antib (ANA)  . Vitamin D (25 hydroxy)  . C-reactive protein

## 2019-02-02 NOTE — Progress Notes (Signed)
Subjective:   Patient ID: Kristin Hinton, female    DOB: 24-Sep-1945, 73 y.o.   MRN: [id number]  Kristin Hinton is a pleasant 73 y.o. year old female who presents to clinic today with Joint Pain (Patient is here today to discuss wide-spread joint pain.  She is wanting to R/O arthritic condition. On 12.12.19 her Vit-D was at 30. Immunizations are UTD.) and Hand Pain (Both hands swollen x2 weeks and also Rt toe pain going on for about 7 months.  Pt would like Dr. Deborra Medina to look at a spot on her abd area. )  on 02/02/2019  HPI: Arthralgias- she feels her hands have been bothering for weeks to months and just feel swollen.  She has been on her ipad and iphone more- ? "just arthritis."  Never red or warm. She is unsure if she has any family history of rheumatoid, psoriatic or other forms of autoimmune type of arthritis. She would like labs done today.  Right toe pain- pain between 2nd and 3rd toes- started in right foot about 7 months ago.  No known injury.  Pain is worse when she puts on shoes, walks or runs.  Has not really gotten any better.   Current Outpatient Medications on File Prior to Visit  Medication Sig Dispense Refill  . estradiol (CLIMARA - DOSED IN MG/24 HR) 0.075 mg/24hr patch Place 1 patch (0.075 mg total) onto the skin 2 (two) times a week. 24 patch 3  . fluticasone (FLONASE) 50 MCG/ACT nasal spray USE 2 SPRAYS IN EACH NOSTRIL DAILY 48 g 1  . pantoprazole (PROTONIX) 40 MG tablet Take 1 tablet (40 mg total) by mouth 2 (two) times daily before a meal. 180 tablet 3  . propranolol ER (INDERAL LA) 60 MG 24 hr capsule Take 1 capsule (60 mg total) by mouth daily. Please make yearly appt with Dr. Caryl Comes for December for future refills. 1st attempt 90 capsule 0  . triamcinolone cream (KENALOG) 0.1 % Apply 1 application topically 2 (two) times daily. (Patient not taking: Reported on 02/02/2019) 30 g 0   No current facility-administered medications on file prior to visit.     Allergies   Allergen Reactions  . Molds & Smuts Itching  . Pollen Extract Itching    Past Medical History:  Diagnosis Date  . History of chicken pox   . Hyperlipidemia   . LBBB (left bundle branch block)   . Migraines   . Schatzki's ring   . SVT (supraventricular tachycardia) ([place])   . Tachycardia     Past Surgical History:  Procedure Laterality Date  . HYSTERECTOMY ABDOMINAL WITH SALPINGECTOMY  03/30/1991  . [place] SURGERY  03/19/1997  . MASTECTOMY Left    Due to surgical complications related to previous breast implant  . PLACEMENT OF BREAST IMPLANTS  03/19/2013  . TONSILLECTOMY  03/20/1983    Family History  Problem Relation Age of Onset  . Congenital heart disease Mother   . Congestive Heart Failure Mother   . Hyperlipidemia Mother   . Hypertension Mother   . Kidney disease Mother   . Arthritis Mother   . Hearing loss Mother   . Cancer Father        Lung  . Breast cancer Sister   . Cancer Brother   . Alcohol abuse Brother   . Drug abuse Brother   . Early death Brother   . Heart attack Brother   . Stroke Maternal Grandmother   . Arthritis Maternal Grandmother   .  Hypercholesterolemia Maternal Grandmother   . Hypertension Maternal Grandmother   . Arthritis Paternal Grandmother   . Heart attack Paternal Grandmother   . Hypercholesterolemia Paternal Grandmother   . Hypertension Paternal Grandmother   . Alcohol abuse Brother   . Early death Brother   . Hypercholesterolemia Son   . Hypertension Son     Social History   Socioeconomic History  . Marital status: Married    Spouse name: Not on file  . Number of children: Not on file  . Years of education: Not on file  . Highest education level: Not on file  Occupational History  . Not on file  Social Needs  . Financial resource strain: Not on file  . Food insecurity    Worry: Not on file    Inability: Not on file  . Transportation needs    Medical: Not on file    Non-medical: Not on file  Tobacco Use  .  Smoking status: Never Smoker  . Smokeless tobacco: Former Network engineer and Sexual Activity  . Alcohol use: Yes    Comment: 7-10 drinks per week  . Drug use: Never  . Sexual activity: Not on file  Lifestyle  . Physical activity    Days per week: Not on file    Minutes per session: Not on file  . Stress: Not on file  Relationships  . Social Herbalist on phone: Not on file    Gets together: Not on file    Attends religious service: Not on file    Active member of club or organization: Not on file    Attends meetings of clubs or organizations: Not on file    Relationship status: Not on file  . Intimate partner violence    Fear of current or ex partner: Not on file    Emotionally abused: Not on file    Physically abused: Not on file    Forced sexual activity: Not on file  Other Topics Concern  . Not on file  Social History Narrative  . Not on file   The PMH, PSH, Social History, Family History, Medications, and allergies have been reviewed in [facility], and have been updated if relevant.    Review of Systems  Constitutional: Negative.   HENT: Negative.   Eyes: Negative.   Respiratory: Negative.   Cardiovascular: Negative.   Gastrointestinal: Negative.   Endocrine: Negative.   Genitourinary: Negative.   Musculoskeletal: Positive for arthralgias and joint swelling. Negative for back pain, gait problem, myalgias, neck pain and neck stiffness.  Skin: Negative.   Allergic/Immunologic: Negative.   Neurological: Negative.   Hematological: Negative.   Psychiatric/Behavioral: Negative.   All other systems reviewed and are negative.      Objective:    BP 120/68   Pulse 66   Temp (!) 97.2 F (36.2 C) (Tympanic)   Ht 5' 4.96" (1.65 m)   Wt 119 lb 12.8 oz (54.3 kg)   SpO2 99%   BMI 19.96 kg/m    Physical Exam Vitals signs and nursing note reviewed.  Constitutional:      Appearance: Normal appearance. She is normal weight.  HENT:     Head: Atraumatic.      Right Ear: External ear normal.     Left Ear: External ear normal.     Nose: Nose normal.     Mouth/Throat:     Mouth: Mucous membranes are moist.  Eyes:     Extraocular Movements: Extraocular movements intact.  Conjunctiva/sclera: Conjunctivae normal.  Neck:     Musculoskeletal: Normal range of motion.  Cardiovascular:     Rate and Rhythm: Normal rate.     Pulses: Normal pulses.  Pulmonary:     Effort: Pulmonary effort is normal.  Musculoskeletal: Normal range of motion.       Feet:     Comments: Some bilateral swelling of dips and pips, no redness warmth or tenderness.  Skin:    General: Skin is warm and dry.  Neurological:     General: No focal deficit present.     Mental Status: She is alert and oriented to person, place, and time.  Psychiatric:        Mood and Affect: Mood normal.        Behavior: Behavior normal.        Thought Content: Thought content normal.        Judgment: Judgment normal.           Assessment & Plan:   Arthralgia, unspecified joint - Plan: Rheumatoid Factor, Sedimentation rate, Antinuclear Antib (ANA)  Vitamin D deficiency - Plan: Vitamin D (25 hydroxy) No follow-ups on file.

## 2019-02-02 NOTE — Assessment & Plan Note (Signed)
Discussed supportive care and handout given from sports medicine advisor.  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  She will try supportive gave discussed in handout first. The patient indicates understanding of these issues and agrees with the plan.

## 2019-02-04 ENCOUNTER — Other Ambulatory Visit: Payer: Self-pay | Admitting: Family Medicine

## 2019-02-04 DIAGNOSIS — R768 Other specified abnormal immunological findings in serum: Secondary | ICD-10-CM

## 2019-02-04 DIAGNOSIS — M2550 Pain in unspecified joint: Secondary | ICD-10-CM

## 2019-02-04 LAB — ANTI-NUCLEAR AB-TITER (ANA TITER): ANA Titer 1: 1:40 {titer} — ABNORMAL HIGH

## 2019-02-04 LAB — RHEUMATOID FACTOR: Rheumatoid fact SerPl-aCnc: <14 IU/mL (ref <14)

## 2019-02-04 LAB — ANA: Anti Nuclear Antibody (ANA): POSITIVE — AB

## 2019-02-23 DIAGNOSIS — M65331 Trigger finger, right middle finger: Secondary | ICD-10-CM | POA: Diagnosis not present

## 2019-02-23 DIAGNOSIS — Z6820 Body mass index (BMI) 20.0-20.9, adult: Secondary | ICD-10-CM | POA: Diagnosis not present

## 2019-02-23 DIAGNOSIS — M7989 Other specified soft tissue disorders: Secondary | ICD-10-CM | POA: Diagnosis not present

## 2019-02-23 DIAGNOSIS — R768 Other specified abnormal immunological findings in serum: Secondary | ICD-10-CM | POA: Diagnosis not present

## 2019-03-23 ENCOUNTER — Other Ambulatory Visit: Payer: Self-pay

## 2019-03-23 MED ORDER — PANTOPRAZOLE SODIUM 40 MG PO TBEC: 40.0000 mg | DELAYED_RELEASE_TABLET | Freq: Two times a day (BID) | ORAL | 3 refills | Status: DC

## 2019-03-23 NOTE — Telephone Encounter (Signed)
Last OV 02/02/19 Last fill 09/03/18  #180/3

## 2019-03-24 ENCOUNTER — Other Ambulatory Visit: Payer: Self-pay | Admitting: Internal Medicine

## 2019-03-24 MED ORDER — PROPRANOLOL HCL ER 60 MG PO CP24: 60.0000 mg | ORAL_CAPSULE | Freq: Every day | ORAL | 0 refills | Status: DC

## 2019-03-24 NOTE — Telephone Encounter (Signed)
Pt's medication was sent to pt's pharmacy as requested. Confirmation received.  °

## 2019-03-29 ENCOUNTER — Other Ambulatory Visit: Payer: Self-pay | Admitting: Family Medicine

## 2019-03-30 NOTE — Telephone Encounter (Signed)
Last oV 02/02/19 Last fill 10/17/18  #48g/1

## 2019-04-01 ENCOUNTER — Telehealth: Payer: Self-pay | Admitting: Internal Medicine

## 2019-04-01 MED ORDER — PROPRANOLOL HCL ER 60 MG PO CP24: 60.0000 mg | ORAL_CAPSULE | Freq: Every day | ORAL | 0 refills | Status: DC

## 2019-04-01 NOTE — Telephone Encounter (Signed)
Pt's medication was sent to pt's pharmacy as requested. Confirmation received.  °

## 2019-04-01 NOTE — Telephone Encounter (Signed)
*  STAT* If patient is at the pharmacy, call can be transferred to refill team.   1. Which medications need to be refilled? (please list name of each medication and dose if known) propranolol ER (INDERAL LA) 60 MG 24 hr capsule  2. Which pharmacy/location (including street and city if local pharmacy) is medication to be sent to? [facility] [place], [place]  3. Do they need a 30 day or 90 day supply? 90 day supply  Patient has scheduled a f/u appointment on 04/28/19 at 1:00 PM with Tommye Standard. Patient states she has has about 15 tablets left.

## 2019-04-26 NOTE — Progress Notes (Signed)
Cardiology Office Note Date:  04/26/2019  Patient ID:  Kristin, Hinton 1945/12/19, MRN [id number] PCP:  Lucille Passy, MD  Cardiologist:  Dr. Oval Linsey Electrophysiologist; Dr. Caryl Comes   Chief Complaint:  annual EP f/u  History of Present Illness: Kristin Hinton is a 74 y.o. female with history of HLD, Schatzki's ring, LBBB, SVT    She had been fairly well controlled with low dose BB for her SVT though in the last year increasing frequency.  Her BB uptitrated though intolerant with fatigue, unable to exercise.  Changed to dilt, though BP did not tolerate titration.   She saw Dr. Caryl Comes 11/2017.  She was not interested in ablation at that time.  She was referred for a ZIO Patch that revealed mostly sinus rhythms with isolated PACs and a nonsustained atrial tachycardia with aberrancy.  He gave her prescriptions of propranolol, nebivolol, and bisoprolol. At her f/u with Dr. Oval Linsey she was on propanolol and doing a bit better though still with daily fleeting palpitations, her propanolol up-titrated slightly to avoid hypotension with plans to c/w EP going forward.  Dr. Caryl Comes reported in his note [facility] 2017 as SVT.  Cover sheet was reviewed and described SVT at 200 bpm with some aberration as well as SVT at 135 with possible atrial tachycardia.    I saw her Dec 2019, she was accompanied by her husband.  She tells me she could not recall life without palpitations, these date back to her 60's.  Her fatigue went back to then as well and had been attributed to her tachycardia.  She reported back in the beginning she would cough and it would stop, in the last several years this has been ineffective.  She has not tried any other vagal maneuvers.  She tried one of the other drug options that Dr. Caryl Comes gave her and it made her fatigue significantly worse, the other was not a covered drug for her.  She feels like the propanolol had perhaps slowed the rate of her tachycardia but not the frequency  of it.  Repored feeling her heart pounding out of her chest especially at night and most days feel like she has it.  She mentions as well episodes daily of feeling lightheaded, not near syncopal, but says if she were to be driving would pull over.  Unclear how long she has had these, but they are not associated with any awareness of her palpitations. No CP or SOB.  She was not sleeping well for a couple reasons, often with palpitations, wakes to use the bathroom, and sometimes just trouble sleeping.  She mentioned last she spoke with Dr. Caryl Comes they discussed ablation, and having her see D. Lovena Le but she was not quite ready, worries about procedures and risks.  Though felt like she may be getting to the point where she would like to hear more about the ablation option.  We planned for 48hr monitor given her daily lightheaded spells it seems post Zio monitor to evaluate firther, also discussed having her see Dr. Lovena Le, though does not look like she did. Monitor noted Atach, symptoms strips were SR, an no noted symptoms with the AT episodes, Dr. Caryl Comes recommended rate control  TODAY She comes in feeling very well !  She is walking in the treadmill almost daily with good exertional capacity, no CP or SOB.  She says her palpitations have really settled down, no ongoing issues with dizzy spells, no near syncope or syncope She tells me she is  feeling very good and tolerating her propanolol well.  She had labs done with her PMD in Nov or Dec and was told they were all "OK".    Past Medical History:  Diagnosis Date  . History of chicken pox   . Hyperlipidemia   . LBBB (left bundle branch block)   . Migraines   . Schatzki's ring   . SVT (supraventricular tachycardia) ([place])   . Tachycardia     Past Surgical History:  Procedure Laterality Date  . HYSTERECTOMY ABDOMINAL WITH SALPINGECTOMY  03/30/1991  . [place] SURGERY  03/19/1997  . MASTECTOMY Left    Due to surgical complications related  to previous breast implant  . PLACEMENT OF BREAST IMPLANTS  03/19/2013  . TONSILLECTOMY  03/20/1983    Current Outpatient Medications  Medication Sig Dispense Refill  . estradiol (CLIMARA - DOSED IN MG/24 HR) 0.075 mg/24hr patch Place 1 patch (0.075 mg total) onto the skin 2 (two) times a week. 24 patch 3  . fluticasone (FLONASE) 50 MCG/ACT nasal spray USE 2 SPRAYS IN EACH NOSTRIL DAILY 48 g 3  . pantoprazole (PROTONIX) 40 MG tablet Take 1 tablet (40 mg total) by mouth 2 (two) times daily before a meal. 180 tablet 3  . propranolol ER (INDERAL LA) 60 MG 24 hr capsule Take 1 capsule (60 mg total) by mouth daily. Please keep upcoming appt in February before anymore refills. Thank you 90 capsule 0  . triamcinolone cream (KENALOG) 0.1 % Apply 1 application topically 2 (two) times daily. (Patient not taking: Reported on 02/02/2019) 30 g 0   No current facility-administered medications for this visit.    Allergies:   Molds & smuts and Pollen extract   Social History:  The patient  reports that she has never smoked. She has quit using smokeless tobacco. She reports current alcohol use. She reports that she does not use drugs.   Family History:  The patient's family history includes Alcohol abuse in her brother and brother; Arthritis in her maternal grandmother, mother, and paternal grandmother; Breast cancer in her sister; Cancer in her brother and father; Congenital heart disease in her mother; Congestive Heart Failure in her mother; Drug abuse in her brother; Early death in her brother and brother; Hearing loss in her mother; Heart attack in her brother and paternal grandmother; Hypercholesterolemia in her maternal grandmother, paternal grandmother, and son; Hyperlipidemia in her mother; Hypertension in her maternal grandmother, mother, paternal grandmother, and son; Kidney disease in her mother; Stroke in her maternal grandmother.  ROS:  Please see the history of present illness.  All other systems  are reviewed and otherwise negative.   PHYSICAL EXAM:  VS:  There were no vitals taken for this visit. BMI: There is no height or weight on file to calculate BMI. Well nourished, well developed, in no acute distress, appears younger then her age 67: normocephalic, atraumatic  Neck: no JVD, carotid bruits or masses Cardiac:  RRR; no significant murmurs, no rubs, or gallops Lungs:  CTA b/l, no wheezing, rhonchi or rales  Abd: soft, nontender MS: no deformity or atrophy Ext: no edema  Skin: warm and dry, no rash Neuro:  No gross deficits appreciated Psych: euthymic mood, full affect   EKG:  Done today an reviewed by myself;  SB 59bpm, no ST/T changes, unchanged from prior   Jan 2020: 48hr monitor Dominant Rhythm  Sinus  Mean HR 68 Range HR 51-137   Total beats 180 K PVC 0 PAC 122  Tachycardia  MULTIPLE WITH RATE RELATED ABERRATION  Appears to be atrial tachycardia  Pauses none    Symptoms noted palpitations were associated with NSR  No symptoms reported with SVT     Rate control     Ziopatch 09/27/17:  Duration: 14d Findings Symptoms  Racing Fluttering >> sinus mostly but not always with isolated PAC Symptom unspecified  Nonsustained atrial tach (wide with aberration) 8 bts >>150 bpm  No symptoms atrial tach with variable conduction  AA interval about 400 msec with variable conduction   17 bts At night  Conclusions  Her daily symptoms of fluttering and racing is most consistent with Isolated PACs in sinus rhtyhm Some nonsustained atrial tachy-- brief and relatively slow- much less frequent    2017 Zio patch SR PVCs/PACs 113 runs of WCT, suspect SVT with rate related abberancy  Echo 07/21/15: LVEF 55%. Mild MR/TR. RVSP 25  Cardiac CT-A 04/28/15: Limited by artifact engaging Cardiac catheterization 08/01/15: Normal coronary arteries. LVEF 60%.  Recent Labs: 05/28/2018: ALT 17; BUN 17; Creatinine, Ser 0.89; Hemoglobin 12.6; Platelets 286.0; Potassium 4.6;  Sodium 136; TSH 1.27  05/28/2018: Cholesterol 205; HDL 85.20; LDL Cholesterol 100; Total CHOL/HDL Ratio 2; Triglycerides 98.0; VLDL 19.6   CrCl cannot be calculated (Patient's most recent lab result is older than the maximum 21 days allowed.).   Wt Readings from Last 3 Encounters:  02/02/19 119 lb 12.8 oz (54.3 kg)  05/28/18 119 lb (54 kg)  03/05/18 120 lb (54.4 kg)     Other studies reviewed: Additional studies/records reviewed today include: summarized above  ASSESSMENT AND PLAN:  1. Palpitations 2. SVT      A Tach with abberations 3. PACs  No ongoing complaints/symptoms She is feeling well, active and exercising regularly    Disposition: We will see her back in 1 year, sooner if needed     Current medicines are reviewed at length with the patient today.  The patient did not have any concerns regarding medicines.  Venetia Night, PA-C 04/26/2019 6:38 PM     [place] [address] [phone] (office)  [phone] (fax)

## 2019-04-28 ENCOUNTER — Other Ambulatory Visit: Payer: Self-pay

## 2019-04-28 ENCOUNTER — Ambulatory Visit (INDEPENDENT_AMBULATORY_CARE_PROVIDER_SITE_OTHER): Payer: Medicare Other | Admitting: Physician Assistant

## 2019-04-28 VITALS — BP 116/70 | HR 59 | Ht 66.0 in | Wt 119.0 lb

## 2019-04-28 DIAGNOSIS — I471 Supraventricular tachycardia: Secondary | ICD-10-CM

## 2019-04-28 DIAGNOSIS — R002 Palpitations: Secondary | ICD-10-CM | POA: Diagnosis not present

## 2019-04-28 MED ORDER — PROPRANOLOL HCL ER 60 MG PO CP24: 60.0000 mg | ORAL_CAPSULE | Freq: Every day | ORAL | 3 refills | Status: DC

## 2019-04-28 NOTE — Patient Instructions (Signed)
Medication Instructions:  Your physician recommends that you continue on your current medications as directed. Please refer to the Current Medication list given to you today.  *If you need a refill on your cardiac medications before your next appointment, please call your pharmacy*  Lab Work: [place]   If you have labs (blood work) drawn today and your tests are completely normal, you will receive your results only by: Marland Kitchen MyChart Message (if you have MyChart) OR . A paper copy in the mail If you have any lab test that is abnormal or we need to change your treatment, we will call you to review the results.  Testing/Procedures:  NONE ORDERED  TODAY  Follow-Up: At [facility], you and your health needs are our priority.  As part of our continuing mission to provide you with exceptional heart care, we have created designated Provider Care Teams.  These Care Teams include your primary Cardiologist (physician) and Advanced Practice Providers (APPs -  Physician Assistants and Nurse Practitioners) who all work together to provide you with the care you need, when you need it.  Your next appointment:   1 year(s)  The format for your next appointment:   In Person  Provider:   You may see  Dr. Caryl Comes  or one of the following Advanced Practice Providers on your designated Care Team:    Chanetta Marshall, NP  Tommye Standard, PA-C  Legrand Como "Oda Kilts, [place]   Other Instructions

## 2019-05-04 ENCOUNTER — Telehealth: Payer: Self-pay | Admitting: Family Medicine

## 2019-05-04 NOTE — Telephone Encounter (Signed)
Patient came into office and requested TOC from Dr. Deborra Medina to Dr. Bryan Lemma. Please advise.

## 2019-05-12 NOTE — Telephone Encounter (Signed)
MC-Pt is requesting a TOC visit with you/she is scheduled for an AWV with Wellness Coach in June/plz advise/thx dmf

## 2019-05-13 NOTE — Telephone Encounter (Signed)
Plz see if pt would be willing to schedule with either Dr. Ethelene Hal or Baldo Ash instead of Dr. Orrin Brigham see her message below/thx dmf

## 2019-05-13 NOTE — Telephone Encounter (Signed)
Unfortunately, I have taken on a large number of patients since Dr. Deborra Medina and Dr. Zigmund Daniel left and unable to take pt at this time. Please offer Dr. Ethelene Hal or Wilfred Lacy

## 2019-05-15 NOTE — Telephone Encounter (Signed)
Pt aware and will look into who she wants to see and call back

## 2019-06-03 ENCOUNTER — Telehealth: Payer: Self-pay | Admitting: General Practice

## 2019-06-03 NOTE — Telephone Encounter (Signed)
Pt was at [facility] seeing Dr. Deborra Medina, provider has left pt is requesting to transfer care to you. Please advise.

## 2019-06-04 NOTE — Telephone Encounter (Signed)
Not able to accommodate new patients at this time would recommend to stay with a provider at current location if able.

## 2019-06-11 ENCOUNTER — Telehealth (HOSPITAL_COMMUNITY): Payer: Self-pay

## 2019-06-11 NOTE — Telephone Encounter (Signed)

## 2019-06-15 ENCOUNTER — Encounter: Payer: Self-pay | Admitting: Vascular Surgery

## 2019-06-15 ENCOUNTER — Ambulatory Visit (INDEPENDENT_AMBULATORY_CARE_PROVIDER_SITE_OTHER): Payer: Self-pay

## 2019-06-15 ENCOUNTER — Other Ambulatory Visit: Payer: Self-pay

## 2019-06-15 DIAGNOSIS — I8393 Asymptomatic varicose veins of bilateral lower extremities: Secondary | ICD-10-CM

## 2019-06-15 NOTE — Progress Notes (Signed)
Treated pt's bilateral front and back very small, very minimal telangiectasia, administered with a 27g butterfly.  Patient received a total of 1 mL. Pt did not require use of a full vial, as she had such small amounts of telangiectasia. Pt tolerated well. Post procedure care instructions provided on both handout and verbally. Will follow PRN.   Photos: Yes.    Compression stockings applied: Yes.

## 2019-07-07 ENCOUNTER — Telehealth: Payer: Self-pay | Admitting: Family Medicine

## 2019-07-07 MED ORDER — ESTRADIOL 0.075 MG/24HR TD PTWK: 0.0750 mg | MEDICATED_PATCH | TRANSDERMAL | 3 refills | Status: DC

## 2019-07-07 NOTE — Telephone Encounter (Signed)
Patient is requesting refill of estradiol 0.075mg  [facility]

## 2019-07-07 NOTE — Telephone Encounter (Signed)
Last ov 05/28/2018 Next OV 08/05/2019 Last fill 11/06/18  #24/3

## 2019-07-07 NOTE — Telephone Encounter (Signed)
Rx refilled.

## 2019-07-10 NOTE — Telephone Encounter (Signed)
Patient states [facility] has been trying to reach [place] regarding an error in this RX. Please call them at [phone].

## 2019-07-10 NOTE — Telephone Encounter (Signed)
Please [facility] to find out about issue with pts Rx

## 2019-07-13 ENCOUNTER — Telehealth: Payer: Self-pay | Admitting: General Practice

## 2019-07-13 NOTE — Telephone Encounter (Signed)
I called [facility] and spoke with someone pertaining to the estradiol patch sent in.  She informed me that the patch sent in is rx'd for once a week wear.  She explained that the patch Vivelledot in rx'd for the twice a week use.  I told her that I would inform Dr. Loletha Grayer and call back or send in rx via electronically.  Invoice # for call back is [phone].

## 2019-07-13 NOTE — Telephone Encounter (Signed)
It looks like Dr. Loletha Grayer did sent patch for 2x/week on 07/09/2019. Where is the confusion coming from?

## 2019-07-13 NOTE — Telephone Encounter (Signed)
Kristin Hinton is calling and needed clarification for estradiol. Patient has TOC with Dr. Loletha Grayer on 5/19. CB is [phone].

## 2019-07-13 NOTE — Telephone Encounter (Signed)
Patient's husband came in again with Pharmacy on the phone. Pharmacy states they clarification on the drug name, quantity, strength and directions. Pharmacist stated the patch prescribed is usually once per week. Husband stated the patient has always been prescribed twice per week. Please call pharmacy at [phone]. Patient also requests call back regarding prescription approval.

## 2019-07-13 NOTE — Telephone Encounter (Signed)
I spoke with [facility] and the patch that was sent in is just for once a week use.  Vivelle Dot would be the one for 2/weekly.  They wanted permission to change rx.

## 2019-07-13 NOTE — Telephone Encounter (Signed)
I called [facility] back and spoke with pharmacist Percell Boston and informed her that it was OK to change patch to Vivelle dot use twice a week.  Per [place].

## 2019-07-13 NOTE — Telephone Encounter (Signed)
Ok to change

## 2019-07-15 MED ORDER — ESTRADIOL 0.075 MG/24HR TD PTWK: 0.0750 mg | MEDICATED_PATCH | TRANSDERMAL | 3 refills | Status: DC

## 2019-07-15 NOTE — Telephone Encounter (Signed)
Rx updated to once weekly

## 2019-07-17 NOTE — Telephone Encounter (Signed)
The office received another drug utilization clarification request. I have spoken with Dr. Bryan Lemma and [facility]. Paperwork on Dr. Vivia Ewing desk.  VO for Estradiol Vivelle Patch 8's 0.075mg  for 1 patch twice a week, disp 24 patches, 3 refills Rod Voegerl RPh with [facility] accepted VO 07/17/19 9:47am Kristin Hinton  Order cancelled for Estradiol (Climara) TD Patch 1's 0.075mg 

## 2019-08-04 ENCOUNTER — Other Ambulatory Visit: Payer: Self-pay

## 2019-08-05 ENCOUNTER — Encounter: Payer: Self-pay | Admitting: Family Medicine

## 2019-08-05 ENCOUNTER — Ambulatory Visit (INDEPENDENT_AMBULATORY_CARE_PROVIDER_SITE_OTHER): Payer: Medicare Other | Admitting: Family Medicine

## 2019-08-05 VITALS — BP 122/70 | HR 62 | Temp 97.3°F | Ht 66.0 in | Wt 114.8 lb

## 2019-08-05 DIAGNOSIS — I471 Supraventricular tachycardia: Secondary | ICD-10-CM | POA: Diagnosis not present

## 2019-08-05 DIAGNOSIS — K222 Esophageal obstruction: Secondary | ICD-10-CM

## 2019-08-05 DIAGNOSIS — I159 Secondary hypertension, unspecified: Secondary | ICD-10-CM | POA: Diagnosis not present

## 2019-08-05 NOTE — Patient Instructions (Signed)
Natrol (delayed or controlled release) melatonin 1mg , 3mg 

## 2019-08-05 NOTE — Progress Notes (Signed)
Kristin Hinton is a 74 y.o. female  Chief Complaint  Patient presents with  . Transitions Of Care    TOC from Dr. Deborra Medina, no concerns.     HPI: Kristin Hinton is a 74 y.o. female who is a former pt of Dr. Deborra Medina, seen today for [facility] appt.  Last labs in 05/2018, did have labs in 01/2019 as part of eval for joint pains.  She follows regularly with cardio for SVT, LBBB. She also has a h/o HTN, hyperlipidemia, migraine headaches, Schatzki rings of esophagus.  Past Medical History:  Diagnosis Date  . History of chicken pox   . Hyperlipidemia   . LBBB (left bundle branch block)   . Migraines   . Schatzki's ring   . SVT (supraventricular tachycardia) ([place])   . Tachycardia     Past Surgical History:  Procedure Laterality Date  . HYSTERECTOMY ABDOMINAL WITH SALPINGECTOMY  03/30/1991  . [place] SURGERY  03/19/1997  . MASTECTOMY Left    Due to surgical complications related to previous breast implant  . PLACEMENT OF BREAST IMPLANTS  03/19/2013  . TONSILLECTOMY  03/20/1983    Social History   Socioeconomic History  . Marital status: Married    Spouse name: Not on file  . Number of children: Not on file  . Years of education: Not on file  . Highest education level: Not on file  Occupational History  . Not on file  Tobacco Use  . Smoking status: Never Smoker  . Smokeless tobacco: Former Network engineer and Sexual Activity  . Alcohol use: Yes    Comment: 7-10 drinks per week  . Drug use: Never  . Sexual activity: Not on file  Other Topics Concern  . Not on file  Social History Narrative  . Not on file   Social Determinants of Health   Financial Resource Strain:   . Difficulty of Paying Living Expenses:   Food Insecurity:   . Worried About Charity fundraiser in the Last Year:   . Arboriculturist in the Last Year:   Transportation Needs:   . Film/video editor (Medical):   Marland Kitchen Lack of Transportation (Non-Medical):   Physical Activity:   . Days of Exercise per  Week:   . Minutes of Exercise per Session:   Stress:   . Feeling of Stress :   Social Connections:   . Frequency of Communication with Friends and Family:   . Frequency of Social Gatherings with Friends and Family:   . Attends Religious Services:   . Active Member of Clubs or Organizations:   . Attends Archivist Meetings:   Marland Kitchen Marital Status:   Intimate Partner Violence:   . Fear of Current or Ex-Partner:   . Emotionally Abused:   Marland Kitchen Physically Abused:   . Sexually Abused:     Family History  Problem Relation Age of Onset  . Congenital heart disease Mother   . Congestive Heart Failure Mother   . Hyperlipidemia Mother   . Hypertension Mother   . Kidney disease Mother   . Arthritis Mother   . Hearing loss Mother   . Cancer Father        Lung  . Breast cancer Sister   . Cancer Brother   . Alcohol abuse Brother   . Drug abuse Brother   . Early death Brother   . Heart attack Brother   . Stroke Maternal Grandmother   . Arthritis Maternal Grandmother   .  Hypercholesterolemia Maternal Grandmother   . Hypertension Maternal Grandmother   . Arthritis Paternal Grandmother   . Heart attack Paternal Grandmother   . Hypercholesterolemia Paternal Grandmother   . Hypertension Paternal Grandmother   . Alcohol abuse Brother   . Early death Brother   . Hypercholesterolemia Son   . Hypertension Son      Immunization History  Administered Date(s) Administered  . Fluad Quad(high Dose 65+) 12/24/2018  . H1N1 01/22/2008  . Influenza, High Dose Seasonal PF 02/16/2005, 02/21/2006, 01/09/2007, 11/27/2007, 01/22/2008, 11/15/2008, 11/09/2009, 12/30/2010, 12/22/2012, 12/23/2013, 01/07/2015, 12/19/2015, 12/25/2016, 12/05/2017  . PFIZER SARS-COV-2 Vaccination 05/12/2019, 06/02/2019  . Pneumococcal Conjugate-13 04/27/2013  . Pneumococcal Polysaccharide-23 06/25/2011  . Tdap 11/09/2009, 07/18/2010  . Zoster 10/22/2007  . Zoster Recombinat (Shingrix) 01/30/2017    Outpatient  Encounter Medications as of 08/05/2019  Medication Sig  . estradiol (CLIMARA - DOSED IN MG/24 HR) 0.075 mg/24hr patch Place 1 patch (0.075 mg total) onto the skin once a week.  . fluticasone (FLONASE) 50 MCG/ACT nasal spray USE 2 SPRAYS IN EACH NOSTRIL DAILY  . pantoprazole (PROTONIX) 40 MG tablet Take 1 tablet (40 mg total) by mouth 2 (two) times daily before a meal.  . propranolol ER (INDERAL LA) 60 MG 24 hr capsule Take 1 capsule (60 mg total) by mouth daily.  Marland Kitchen triamcinolone cream (KENALOG) 0.1 % Apply 1 application topically 2 (two) times daily.   No facility-administered encounter medications on file as of 08/05/2019.     ROS: Gen: no fever, chills  Skin: no rash, itching ENT: no ear pain, ear drainage, nasal congestion, rhinorrhea, sinus pressure, sore throat Eyes: no blurry vision, double vision Resp: no cough, wheeze,SOB CV: no CP, palpitations, LE edema,  GI: no heartburn, n/v/d/c, abd pain GU: no dysuria, urgency, frequency, hematuria  MSK: no joint pain, myalgias, back pain Neuro: no dizziness, headache, weakness, vertigo Psych: no depression, anxiety, insomnia   Allergies  Allergen Reactions  . Molds & Smuts Itching  . Pollen Extract Itching    BP 122/70   Pulse 62   Temp (!) 97.3 F (36.3 C) (Tympanic)   Ht 5\' 6"  (1.676 m)   Wt 114 lb 12.8 oz (52.1 kg)   SpO2 98%   BMI 18.53 kg/m   Physical Exam  Constitutional: She is oriented to person, place, and time. She appears well-developed and well-nourished. No distress.  Cardiovascular: Normal rate and regular rhythm.  Pulmonary/Chest: Effort normal and breath sounds normal. No respiratory distress.  Musculoskeletal:        General: No edema.  Neurological: She is alert and oriented to person, place, and time.  Psychiatric: She has a normal mood and affect. Her behavior is normal.     A/P:  1. Secondary hypertension - controlled, at goal - cont current med  2. SVT (supraventricular tachycardia) (HCC) -  stable, follows with cardio - cont inderal   3. Schatzki's ring - stable, dilated in 2015/2016 - cont protonix    This visit occurred during the SARS-CoV-2 public health emergency.  Safety protocols were in place, including screening questions prior to the visit, additional usage of staff PPE, and extensive cleaning of exam room while observing appropriate contact time as indicated for disinfecting solutions.

## 2019-08-25 DIAGNOSIS — H2513 Age-related nuclear cataract, bilateral: Secondary | ICD-10-CM | POA: Diagnosis not present

## 2019-09-08 NOTE — Progress Notes (Signed)
Subjective:   Keaisha Sublette is a 74 y.o. female who presents for Medicare Annual (Subsequent) preventive examination.  I connected with Varonica today by telephone and verified that I am speaking with the correct person using two identifiers. Location patient: home Location provider: work Persons participating in the virtual visit: patient, Marine scientist.    I discussed the limitations, risks, security and privacy concerns of performing an evaluation and management service by telephone and the availability of in person appointments. I also discussed with the patient that there may be a patient responsible charge related to this service. The patient expressed understanding and verbally consented to this telephonic visit.    Interactive audio and video telecommunications were attempted between this provider and patient, however failed, due to patient having technical difficulties OR patient did not have access to video capability.  We continued and completed visit with audio only.  Some vital signs may be absent or patient reported.   Time Spent with patient on telephone encounter: 25 minutes  Review of Systems     Cardiac Risk Factors include: advanced age (>13men, >7 women);hypertension     Objective:    Today's Vitals   09/09/19 1113  Weight: 114 lb (51.7 kg)  Height: 5\' 6"  (1.676 m)   Body mass index is 18.4 kg/m.  Advanced Directives 09/09/2019 09/03/2018  Does Patient Have a Medical Advance Directive? Yes Yes  Type of Paramedic of [place];Living will Living will  Does patient want to make changes to medical advance directive? - No - Patient declined  Copy of [place] in Chart? No - copy requested -    Current Medications (verified) Outpatient Encounter Medications as of 09/09/2019  Medication Sig  . estradiol (CLIMARA - DOSED IN MG/24 HR) 0.075 mg/24hr patch Place 1 patch (0.075 mg total) onto the skin once a week.  . fluticasone  (FLONASE) 50 MCG/ACT nasal spray USE 2 SPRAYS IN EACH NOSTRIL DAILY  . pantoprazole (PROTONIX) 40 MG tablet Take 1 tablet (40 mg total) by mouth 2 (two) times daily before a meal.  . propranolol ER (INDERAL LA) 60 MG 24 hr capsule Take 1 capsule (60 mg total) by mouth daily.  Marland Kitchen triamcinolone cream (KENALOG) 0.1 % Apply 1 application topically 2 (two) times daily.   No facility-administered encounter medications on file as of 09/09/2019.    Allergies (verified) Molds & smuts and Pollen extract   History: Past Medical History:  Diagnosis Date  . History of chicken pox   . Hyperlipidemia   . LBBB (left bundle branch block)   . Migraines   . Schatzki's ring   . SVT (supraventricular tachycardia) ([place])   . Tachycardia    Past Surgical History:  Procedure Laterality Date  . HYSTERECTOMY ABDOMINAL WITH SALPINGECTOMY  03/30/1991  . [place] SURGERY  03/19/1997  . MASTECTOMY Left    Due to surgical complications related to previous breast implant  . PLACEMENT OF BREAST IMPLANTS  03/19/2013  . TONSILLECTOMY  03/20/1983   Family History  Problem Relation Age of Onset  . Congenital heart disease Mother   . Congestive Heart Failure Mother   . Hyperlipidemia Mother   . Hypertension Mother   . Kidney disease Mother   . Arthritis Mother   . Hearing loss Mother   . Cancer Father        Lung  . Breast cancer Sister   . Cancer Brother   . Alcohol abuse Brother   . Drug abuse  Brother   . Early death Brother   . Heart attack Brother   . Stroke Maternal Grandmother   . Arthritis Maternal Grandmother   . Hypercholesterolemia Maternal Grandmother   . Hypertension Maternal Grandmother   . Arthritis Paternal Grandmother   . Heart attack Paternal Grandmother   . Hypercholesterolemia Paternal Grandmother   . Hypertension Paternal Grandmother   . Alcohol abuse Brother   . Early death Brother   . Hypercholesterolemia Son   . Hypertension Son    Social History   Socioeconomic  History  . Marital status: Married    Spouse name: Not on file  . Number of children: Not on file  . Years of education: Not on file  . Highest education level: Not on file  Occupational History  . Occupation: Retired  Tobacco Use  . Smoking status: Never Smoker  . Smokeless tobacco: Former Network engineer  . Vaping Use: Never used  Substance and Sexual Activity  . Alcohol use: Yes    Comment: 7-10 drinks per week  . Drug use: Never  . Sexual activity: Not on file  Other Topics Concern  . Not on file  Social History Narrative  . Not on file   Social Determinants of Health   Financial Resource Strain: Low Risk   . Difficulty of Paying Living Expenses: Not hard at all  Food Insecurity: No Food Insecurity  . Worried About Charity fundraiser in the Last Year: Never true  . Ran Out of Food in the Last Year: Never true  Transportation Needs: No Transportation Needs  . Lack of Transportation (Medical): No  . Lack of Transportation (Non-Medical): No  Physical Activity: Sufficiently Active  . Days of Exercise per Week: 7 days  . Minutes of Exercise per Session: 30 min  Stress: No Stress Concern Present  . Feeling of Stress : Not at all  Social Connections: Moderately Isolated  . Frequency of Communication with Friends and Family: More than three times a week  . Frequency of Social Gatherings with Friends and Family: Once a week  . Attends Religious Services: Never  . Active Member of Clubs or Organizations: No  . Attends Archivist Meetings: Never  . Marital Status: Married    Tobacco Counseling Counseling given: Not Answered   Clinical Intake:  Pre-visit preparation completed: Yes  Pain : No/denies pain     Nutritional Status: BMI <19  Underweight Nutritional Risks: None Diabetes: No  How often do you need to have someone help you when you read instructions, pamphlets, or other written materials from your doctor or pharmacy?: 1 - Never What is the  last grade level you completed in school?: some college  Diabetic?No  Interpreter Needed?: No  Information entered by :: Caroleen Hamman LPN Activities of Daily Living In your present state of health, do you have any difficulty performing the following activities: 09/09/2019  Hearing? N  Vision? N  Difficulty concentrating or making decisions? N  Walking or climbing stairs? N  Dressing or bathing? N  Doing errands, shopping? N  Preparing Food and eating ? N  Using the Toilet? N  In the past six months, have you accidently leaked urine? N  Do you have problems with loss of bowel control? N  Managing your Medications? N  Managing your Finances? N  Housekeeping or managing your Housekeeping? N  Some recent data might be hidden    Patient Care Team: Ronnald Nian, DO as PCP -  General (Family Medicine)  Indicate any recent Medical Services you may have received from other than [facility] providers in the past year (date may be approximate).     Assessment:   This is a routine wellness examination for Shuna.  Hearing/Vision screen  Hearing Screening   125Hz  250Hz  500Hz  1000Hz  2000Hz  3000Hz  4000Hz  6000Hz  8000Hz   Right ear:           Left ear:           Comments: No issues  Vision Screening Comments: Wears reading glasses Last eye exam 08/2019 Dr Doyce Para  Dietary issues and exercise activities discussed: Current Exercise Habits: Home exercise routine, Type of exercise: strength training/weights;treadmill;yoga, Time (Minutes): 30, Frequency (Times/Week): 7, Weekly Exercise (Minutes/Week): 210  Goals    . Maintain healthy active lifestyle.    . Patient Stated     Maintain current healthy lifestyle      Depression Screen PHQ 2/9 Scores 09/09/2019 09/03/2018 05/28/2018 07/16/2017  PHQ - 2 Score 0 0 0 0    Fall Risk Fall Risk  09/09/2019 08/05/2019 09/03/2018 05/28/2018 07/16/2017  Falls in the past year? 0 0 0 0 No  Number falls in past yr: 0 - - - -  Injury with Fall? 0 - - - -    Risk for fall due to : No Fall Risks - - - -  Follow up Falls prevention discussed - - - -    Any stairs in or around the home? Yes  If so, are there any without handrails? No  Home free of loose throw rugs in walkways, pet beds, electrical cords, etc? Yes  Adequate lighting in your home to reduce risk of falls? Yes   ASSISTIVE DEVICES UTILIZED TO PREVENT FALLS:  Life alert? No  Use of a cane, walker or w/c? No  Grab bars in the bathroom? No  Shower chair or bench in shower? No  Elevated toilet seat or a handicapped toilet? No   TIMED UP AND GO:  Was the test performed? No . Virtual visit     Cognitive Function: Patient states she plays wordscape frequently.     6CIT Screen 09/09/2019  What Year? 0 points  What month? 0 points  What time? 0 points  Count back from 20 0 points  Months in reverse 0 points  Repeat phrase 0 points  Total Score 0    Immunizations Immunization History  Administered Date(s) Administered  . Fluad Quad(high Dose 65+) 12/24/2018  . H1N1 01/22/2008  . Influenza, High Dose Seasonal PF 02/16/2005, 02/21/2006, 01/09/2007, 11/27/2007, 01/22/2008, 11/15/2008, 11/09/2009, 12/30/2010, 12/22/2012, 12/23/2013, 01/07/2015, 12/19/2015, 12/25/2016, 12/05/2017  . PFIZER SARS-COV-2 Vaccination 05/12/2019, 06/02/2019  . Pneumococcal Conjugate-13 04/27/2013  . Pneumococcal Polysaccharide-23 06/25/2011  . Tdap 11/09/2009, 07/18/2010  . Zoster 10/22/2007  . Zoster Recombinat (Shingrix) 01/30/2017    TDAP status: Up to date 07/18/2010 Flu Vaccine status: Up to date Pneumococcal vaccine status: Up to date Covid-19 vaccine status: Completed vaccines  Qualifies for Shingles Vaccine?  Zostavax completed Yes   Shingrix Completed?: Yes  Screening Tests Health Maintenance  Topic Date Due  . INFLUENZA VACCINE  10/18/2019  . TETANUS/TDAP  07/17/2020  . MAMMOGRAM  11/19/2020  . COLONOSCOPY  03/24/2023  . DEXA SCAN  Completed  . COVID-19 Vaccine  Completed   . Hepatitis C Screening  Completed  . PNA vac Low Risk Adult  Completed    Health Maintenance  There are no preventive care reminders to display for this patient.  Colorectal cancer  screening: Completed 03/23/2013. Repeat every 10 years   Mammogram status: Completed 11/20/2018. Repeat every year; ordered today to be scheduled in Sept.  Bone Density: Ordered today. Patient notified that someone will call to schedule.Patient wishes to schedule in Sept with mammogram.  Lung Cancer Screening: (Low Dose CT Chest recommended if Age 52-80 years, 30 pack-year currently smoking OR have quit w/in 15years.) does not qualify.    Additional Screening:  Hepatitis C Screening:  Completed 05/28/2018  Vision Screening: Recommended annual ophthalmology exams for early detection of glaucoma and other disorders of the eye. Is the patient up to date with their annual eye exam?  Yes  Who is the provider or what is the name of the office in which the patient attends annual eye exams? Dr. Katy Fitch .   Dental Screening: Recommended annual dental exams for proper oral hygiene  Community Resource Referral / Chronic Care Management: CRR required this visit?  No   CCM required this visit?  No      Plan:     I have personally reviewed and noted the following in the patient's chart:   . Medical and social history . Use of alcohol, tobacDr. Robinette Haines or illicit drugs  . Current medications and supplements . Functional ability and status . Nutritional status . Physical activity . Advanced directives . List of other physicians . Hospitalizations, surgeries, and ER visits in previous 12 months . Vitals . Screenings to include cognitive, depression, and falls . Referrals and appointments  In addition, I have reviewed and discussed with patient certain preventive protocols, quality metrics, and best practice recommendations. A written personalized care plan for preventive services as well as general  preventive health recommendations were provided to patient.     Due to this being a telephonic visit, the after visit summary with patients personalized plan was offered to patient via mail or my-chart.Patient would like to access on my-chart.   Marta Antu, LPN   06/09/4008  Nurse Health Advisor  Nurse Notes: None

## 2019-09-09 ENCOUNTER — Ambulatory Visit: Payer: Medicare Other | Admitting: *Deleted

## 2019-09-09 ENCOUNTER — Encounter: Payer: Self-pay | Admitting: Family Medicine

## 2019-09-09 ENCOUNTER — Ambulatory Visit (INDEPENDENT_AMBULATORY_CARE_PROVIDER_SITE_OTHER): Payer: Medicare Other

## 2019-09-09 VITALS — Ht 66.0 in | Wt 114.0 lb

## 2019-09-09 DIAGNOSIS — Z1231 Encounter for screening mammogram for malignant neoplasm of breast: Secondary | ICD-10-CM

## 2019-09-09 DIAGNOSIS — Z780 Asymptomatic menopausal state: Secondary | ICD-10-CM

## 2019-09-09 DIAGNOSIS — Z0000 Encounter for general adult medical examination without abnormal findings: Secondary | ICD-10-CM | POA: Diagnosis not present

## 2019-09-09 DIAGNOSIS — Z13820 Encounter for screening for osteoporosis: Secondary | ICD-10-CM | POA: Diagnosis not present

## 2019-09-09 NOTE — Patient Instructions (Signed)
Kristin Hinton , Thank you for taking time to come for your Medicare Wellness Visit. I appreciate your ongoing commitment to your health goals. Please review the following plan we discussed and let me know if I can assist you in the future.   Screening recommendations/referrals: Colonoscopy: Completed 03/23/2013- Repeat every 10 years-No longer indicated after age 74. Mammogram: Completed 11/20/2018-Due-11/20/2019-Ordered-Someone will be calling to schedule appointment. Bone Density: Completed 03/15/2001-Ordered-Someone will be calling to schedule along with mammogram. Recommended yearly ophthalmology/optometry visit for glaucoma screening and checkup Recommended yearly dental visit for hygiene and checkup  Vaccinations: Influenza vaccine: Up to date- Due-11/2019 Pneumococcal vaccine: Completed vaccines Tdap vaccine: Up to date 07/18/2010-Due 07/17/2020 Shingles vaccine: Completed vaccines   Covid-19:Completed vaccines  Advanced directives: Please bring a copy to your next office visit  Conditions/risks identified: See problem list  Next appointment: Follow up in one year for your annual wellness visit - 09/14/2020 @ 11:15am.   Preventive Care 65 Years and Older, Female Preventive care refers to lifestyle choices and visits with your health care provider that can promote health and wellness. What does preventive care include?  A yearly physical exam. This is also called an annual well check.  Dental exams once or twice a year.  Routine eye exams. Ask your health care provider how often you should have your eyes checked.  Personal lifestyle choices, including:  Daily care of your teeth and gums.  Regular physical activity.  Eating a healthy diet.  Avoiding tobacco and drug use.  Limiting alcohol use.  Practicing safe sex.  Taking low-dose aspirin every day.  Taking vitamin and mineral supplements as recommended by your health care provider. What happens during an annual well  check? The services and screenings done by your health care provider during your annual well check will depend on your age, overall health, lifestyle risk factors, and family history of disease. Counseling  Your health care provider may ask you questions about your:  Alcohol use.  Tobacco use.  Drug use.  Emotional well-being.  Home and relationship well-being.  Sexual activity.  Eating habits.  History of falls.  Memory and ability to understand (cognition).  Work and work Statistician.  Reproductive health. Screening  You may have the following tests or measurements:  Height, weight, and BMI.  Blood pressure.  Lipid and cholesterol levels. These may be checked every 5 years, or more frequently if you are over 73 years old.  Skin check.  Lung cancer screening. You may have this screening every year starting at age 97 if you have a 30-pack-year history of smoking and currently smoke or have quit within the past 15 years.  Fecal occult blood test (FOBT) of the stool. You may have this test every year starting at age 53.  Flexible sigmoidoscopy or colonoscopy. You may have a sigmoidoscopy every 5 years or a colonoscopy every 10 years starting at age 36.  Hepatitis C blood test.  Hepatitis B blood test.  Sexually transmitted disease (STD) testing.  Diabetes screening. This is done by checking your blood sugar (glucose) after you have not eaten for a while (fasting). You may have this done every 1-3 years.  Bone density scan. This is done to screen for osteoporosis. You may have this done starting at age 10.  Mammogram. This may be done every 1-2 years. Talk to your health care provider about how often you should have regular mammograms. Talk with your health care provider about your test results, treatment options, and if necessary,  the need for more tests. Vaccines  Your health care provider may recommend certain vaccines, such as:  Influenza vaccine. This is  recommended every year.  Tetanus, diphtheria, and acellular pertussis (Tdap, Td) vaccine. You may need a Td booster every 10 years.  Zoster vaccine. You may need this after age 41.  Pneumococcal 13-valent conjugate (PCV13) vaccine. One dose is recommended after age 39.  Pneumococcal polysaccharide (PPSV23) vaccine. One dose is recommended after age 20. Talk to your health care provider about which screenings and vaccines you need and how often you need them. This information is not intended to replace advice given to you by your health care provider. Make sure you discuss any questions you have with your health care provider. Document Released: 04/01/2015 Document Revised: 11/23/2015 Document Reviewed: 01/04/2015 Elsevier Interactive Patient Education  2017 [place] Prevention in the Home Falls can cause injuries. They can happen to people of all ages. There are many things you can do to make your home safe and to help prevent falls. What can I do on the outside of my home?  Regularly fix the edges of walkways and driveways and fix any cracks.  Remove anything that might make you trip as you walk through a door, such as a raised step or threshold.  Trim any bushes or trees on the path to your home.  Use bright outdoor lighting.  Clear any walking paths of anything that might make someone trip, such as rocks or tools.  Regularly check to see if handrails are loose or broken. Make sure that both sides of any steps have handrails.  Any raised decks and porches should have guardrails on the edges.  Have any leaves, snow, or ice cleared regularly.  Use sand or salt on walking paths during winter.  Clean up any spills in your garage right away. This includes oil or grease spills. What can I do in the bathroom?  Use night lights.  Install grab bars by the toilet and in the tub and shower. Do not use towel bars as grab bars.  Use non-skid mats or decals in the tub or  shower.  If you need to sit down in the shower, use a plastic, non-slip stool.  Keep the floor dry. Clean up any water that spills on the floor as soon as it happens.  Remove soap buildup in the tub or shower regularly.  Attach bath mats securely with double-sided non-slip rug tape.  Do not have throw rugs and other things on the floor that can make you trip. What can I do in the bedroom?  Use night lights.  Make sure that you have a light by your bed that is easy to reach.  Do not use any sheets or blankets that are too big for your bed. They should not hang down onto the floor.  Have a firm chair that has side arms. You can use this for support while you get dressed.  Do not have throw rugs and other things on the floor that can make you trip. What can I do in the kitchen?  Clean up any spills right away.  Avoid walking on wet floors.  Keep items that you use a lot in easy-to-reach places.  If you need to reach something above you, use a strong step stool that has a grab bar.  Keep electrical cords out of the way.  Do not use floor polish or wax that makes floors slippery. If you must use  wax, use non-skid floor wax.  Do not have throw rugs and other things on the floor that can make you trip. What can I do with my stairs?  Do not leave any items on the stairs.  Make sure that there are handrails on both sides of the stairs and use them. Fix handrails that are broken or loose. Make sure that handrails are as long as the stairways.  Check any carpeting to make sure that it is firmly attached to the stairs. Fix any carpet that is loose or worn.  Avoid having throw rugs at the top or bottom of the stairs. If you do have throw rugs, attach them to the floor with carpet tape.  Make sure that you have a light switch at the top of the stairs and the bottom of the stairs. If you do not have them, ask someone to add them for you. What else can I do to help prevent  falls?  Wear shoes that:  Do not have high heels.  Have rubber bottoms.  Are comfortable and fit you well.  Are closed at the toe. Do not wear sandals.  If you use a stepladder:  Make sure that it is fully opened. Do not climb a closed stepladder.  Make sure that both sides of the stepladder are locked into place.  Ask someone to hold it for you, if possible.  Clearly mark and make sure that you can see:  Any grab bars or handrails.  First and last steps.  Where the edge of each step is.  Use tools that help you move around (mobility aids) if they are needed. These include:  Canes.  Walkers.  Scooters.  Crutches.  Turn on the lights when you go into a dark area. Replace any light bulbs as soon as they burn out.  Set up your furniture so you have a clear path. Avoid moving your furniture around.  If any of your floors are uneven, fix them.  If there are any pets around you, be aware of where they are.  Review your medicines with your doctor. Some medicines can make you feel dizzy. This can increase your chance of falling. Ask your doctor what other things that you can do to help prevent falls. This information is not intended to replace advice given to you by your health care provider. Make sure you discuss any questions you have with your health care provider. Document Released: 12/30/2008 Document Revised: 08/11/2015 Document Reviewed: 04/09/2014 Elsevier Interactive Patient Education  2017 [employer].

## 2019-09-17 ENCOUNTER — Telehealth: Payer: Self-pay | Admitting: Family Medicine

## 2019-09-17 NOTE — Telephone Encounter (Signed)
Patient is calling and wanted to see if she can speak to someone regarding getting a bone density scheduled. Informed patient that Dr. Loletha Grayer. Will be out of the office until next week and request may not be answered until she returns. Patient understood and and said request wasn't  urgent.

## 2019-09-17 NOTE — Telephone Encounter (Signed)
Please see message and advise.  Thank you. ° °

## 2019-09-22 NOTE — Telephone Encounter (Signed)
Referrals for bone density (dexa) scan and mammo were placed on 09/09/19 to [facility]. Pt should call them to schedule - [phone].

## 2019-09-22 NOTE — Progress Notes (Signed)
I have collaborated with the care management provider regarding care management and care coordination activities outlined in this encounter and have reviewed this encounter including documentation in the note and care plan. I am certifying that I agree with the content of this note and encounter as supervising physician.  

## 2019-10-15 NOTE — Telephone Encounter (Signed)
Kristin Hinton, can these referrals please be changed to pts preferred location? Thanks!

## 2019-10-15 NOTE — Telephone Encounter (Signed)
Patient called back and asked if Bone Density and Mammogram referral be sent to the Imaging location in [facility], please advise. CB is [phone].

## 2019-10-15 NOTE — Telephone Encounter (Signed)
Please see message and advise.  Thank you. ° °

## 2019-10-22 NOTE — Telephone Encounter (Signed)
Pt is scheduled 9/3 at [place]

## 2019-11-16 ENCOUNTER — Telehealth: Payer: Self-pay

## 2019-11-16 ENCOUNTER — Telehealth: Payer: Self-pay | Admitting: Family Medicine

## 2019-11-16 NOTE — Telephone Encounter (Signed)
Patient is calling and stated that she scheduled to have bone density scan and a mammogram at the [facility] but prefers to go to [facility] on [address] in [place], please advise. CB is [phone]

## 2019-11-17 NOTE — Telephone Encounter (Signed)
Dr. Loletha Grayer this is just a heads up. Pt wanted to schedule mammogram and bone density at [facility] on [address] in [place].

## 2019-11-17 NOTE — Telephone Encounter (Signed)
LM to notify pt that appts with [facility] imaging will be cancelled and that we'll get orders signed 9/1 by Dr. Bryan Lemma to fax to [facility] on [address] in [place].

## 2019-11-17 NOTE — Telephone Encounter (Signed)
error 

## 2019-11-18 MED ORDER — PANTOPRAZOLE SODIUM 40 MG PO TBEC: 40.0000 mg | DELAYED_RELEASE_TABLET | Freq: Two times a day (BID) | ORAL | 3 refills | Status: DC

## 2019-11-18 NOTE — Telephone Encounter (Signed)
Orders was faxed over to [facility] at [phone].

## 2019-11-18 NOTE — Telephone Encounter (Signed)
Orders signed and left on your desk to be faxed

## 2019-11-20 ENCOUNTER — Other Ambulatory Visit (HOSPITAL_BASED_OUTPATIENT_CLINIC_OR_DEPARTMENT_OTHER): Payer: Medicare Other

## 2019-11-20 ENCOUNTER — Ambulatory Visit (HOSPITAL_BASED_OUTPATIENT_CLINIC_OR_DEPARTMENT_OTHER): Payer: Medicare Other

## 2019-12-10 ENCOUNTER — Ambulatory Visit: Payer: Medicare Other

## 2019-12-10 ENCOUNTER — Other Ambulatory Visit: Payer: Medicare Other

## 2019-12-11 DIAGNOSIS — L812 Freckles: Secondary | ICD-10-CM | POA: Diagnosis not present

## 2019-12-11 DIAGNOSIS — L821 Other seborrheic keratosis: Secondary | ICD-10-CM | POA: Diagnosis not present

## 2019-12-11 DIAGNOSIS — L728 Other follicular cysts of the skin and subcutaneous tissue: Secondary | ICD-10-CM | POA: Diagnosis not present

## 2019-12-11 DIAGNOSIS — D229 Melanocytic nevi, unspecified: Secondary | ICD-10-CM | POA: Diagnosis not present

## 2019-12-11 DIAGNOSIS — L718 Other rosacea: Secondary | ICD-10-CM | POA: Diagnosis not present

## 2019-12-11 DIAGNOSIS — L814 Other melanin hyperpigmentation: Secondary | ICD-10-CM | POA: Diagnosis not present

## 2019-12-11 DIAGNOSIS — L819 Disorder of pigmentation, unspecified: Secondary | ICD-10-CM | POA: Diagnosis not present

## 2019-12-11 DIAGNOSIS — D1801 Hemangioma of skin and subcutaneous tissue: Secondary | ICD-10-CM | POA: Diagnosis not present

## 2019-12-16 ENCOUNTER — Encounter: Payer: Self-pay | Admitting: Family Medicine

## 2019-12-16 DIAGNOSIS — Z780 Asymptomatic menopausal state: Secondary | ICD-10-CM | POA: Diagnosis not present

## 2019-12-16 DIAGNOSIS — Z1231 Encounter for screening mammogram for malignant neoplasm of breast: Secondary | ICD-10-CM | POA: Diagnosis not present

## 2019-12-16 LAB — HM DEXA SCAN: HM Dexa Scan: NORMAL

## 2019-12-16 LAB — HM MAMMOGRAPHY

## 2019-12-23 DIAGNOSIS — Z681 Body mass index (BMI) 19 or less, adult: Secondary | ICD-10-CM | POA: Diagnosis not present

## 2019-12-23 DIAGNOSIS — M65331 Trigger finger, right middle finger: Secondary | ICD-10-CM | POA: Diagnosis not present

## 2019-12-23 DIAGNOSIS — R768 Other specified abnormal immunological findings in serum: Secondary | ICD-10-CM | POA: Diagnosis not present

## 2019-12-23 DIAGNOSIS — M7989 Other specified soft tissue disorders: Secondary | ICD-10-CM | POA: Diagnosis not present

## 2020-03-25 ENCOUNTER — Other Ambulatory Visit: Payer: Self-pay

## 2020-03-25 MED ORDER — FLUTICASONE PROPIONATE 50 MCG/ACT NA SUSP: 2.0000 | Freq: Every day | NASAL | 0 refills | Status: DC

## 2020-03-29 DIAGNOSIS — Z20822 Contact with and (suspected) exposure to covid-19: Secondary | ICD-10-CM | POA: Diagnosis not present

## 2020-04-15 DIAGNOSIS — Z20822 Contact with and (suspected) exposure to covid-19: Secondary | ICD-10-CM | POA: Diagnosis not present

## 2020-04-22 ENCOUNTER — Other Ambulatory Visit: Payer: Self-pay | Admitting: Physician Assistant

## 2020-04-26 ENCOUNTER — Other Ambulatory Visit: Payer: Self-pay

## 2020-04-27 ENCOUNTER — Ambulatory Visit (INDEPENDENT_AMBULATORY_CARE_PROVIDER_SITE_OTHER): Payer: Medicare Other | Admitting: Nurse Practitioner

## 2020-04-27 ENCOUNTER — Encounter: Payer: Self-pay | Admitting: Nurse Practitioner

## 2020-04-27 VITALS — BP 116/64 | HR 61 | Temp 96.7°F | Ht 66.5 in | Wt 120.8 lb

## 2020-04-27 DIAGNOSIS — S161XXA Strain of muscle, fascia and tendon at neck level, initial encounter: Secondary | ICD-10-CM

## 2020-04-27 MED ORDER — TIZANIDINE HCL 2 MG PO CAPS: 2.0000 mg | ORAL_CAPSULE | Freq: Every evening | ORAL | 0 refills | Status: DC | PRN

## 2020-04-27 NOTE — Patient Instructions (Signed)
Alternate between warm and cold compress as needed. Continue use of aleve 220mg  1-2tabs every 12hrs x 5days, with food. Take zanaflex at bedtime due to risk of sedation. Call office if no complete resolution in 1week or if you develop any new symtptoms  Neck Exercises Ask your health care provider which exercises are safe for you. Do exercises exactly as told by your health care provider and adjust them as directed. It is normal to feel mild stretching, pulling, tightness, or discomfort as you do these exercises. Stop right away if you feel sudden pain or your pain gets worse. Do not begin these exercises until told by your health care provider. Neck exercises can be important for many reasons. They can improve strength and maintain flexibility in your neck, which will help your upper back and prevent neck pain. Stretching exercises Rotation neck stretching 1. Sit in a chair or stand up. 2. Place your feet flat on the floor, shoulder width apart. 3. Slowly turn your head (rotate) to the right until a slight stretch is felt. Turn it all the way to the right so you can look over your right shoulder. Do not tilt or tip your head. 4. Hold this position for 10-30 seconds. 5. Slowly turn your head (rotate) to the left until a slight stretch is felt. Turn it all the way to the left so you can look over your left shoulder. Do not tilt or tip your head. 6. Hold this position for 10-30 seconds. Repeat __________ times. Complete this exercise __________ times a day.   Neck retraction 1. Sit in a sturdy chair or stand up. 2. Look straight ahead. Do not bend your neck. 3. Use your fingers to push your chin backward (retraction). Do not bend your neck for this movement. Continue to face straight ahead. If you are doing the exercise properly, you will feel a slight sensation in your throat and a stretch at the back of your neck. 4. Hold the stretch for 1-2 seconds. Repeat __________ times. Complete this  exercise __________ times a day. Strengthening exercises Neck press 1. Lie on your back on a firm bed or on the floor with a pillow under your head. 2. Use your neck muscles to push your head down on the pillow and straighten your spine. 3. Hold the position as well as you can. Keep your head facing up (in a neutral position) and your chin tucked. 4. Slowly count to 5 while holding this position. Repeat __________ times. Complete this exercise __________ times a day. Isometrics These are exercises in which you strengthen the muscles in your neck while keeping your neck still (isometrics). 1. Sit in a supportive chair and place your hand on your forehead. 2. Keep your head and face facing straight ahead. Do not flex or extend your neck while doing isometrics. 3. Push forward with your head and neck while pushing back with your hand. Hold for 10 seconds. 4. Do the sequence again, this time putting your hand against the back of your head. Use your head and neck to push backward against the hand pressure. 5. Finally, do the same exercise on either side of your head, pushing sideways against the pressure of your hand. Repeat __________ times. Complete this exercise __________ times a day. Prone head lifts 1. Lie face-down (prone position), resting on your elbows so that your chest and upper back are raised. 2. Start with your head facing downward, near your chest. Position your chin either on or near  your chest. 3. Slowly lift your head upward. Lift until you are looking straight ahead. Then continue lifting your head as far back as you can comfortably stretch. 4. Hold your head up for 5 seconds. Then slowly lower it to your starting position. Repeat __________ times. Complete this exercise __________ times a day. Supine head lifts 1. Lie on your back (supine position), bending your knees to point to the ceiling and keeping your feet flat on the floor. 2. Lift your head slowly off the floor,  raising your chin toward your chest. 3. Hold for 5 seconds. Repeat __________ times. Complete this exercise __________ times a day. Scapular retraction 1. Stand with your arms at your sides. Look straight ahead. 2. Slowly pull both shoulders (scapulae) backward and downward (retraction) until you feel a stretch between your shoulder blades in your upper back. 3. Hold for 10-30 seconds. 4. Relax and repeat. Repeat __________ times. Complete this exercise __________ times a day. Contact a health care provider if:  Your neck pain or discomfort gets much worse when you do an exercise.  Your neck pain or discomfort does not improve within 2 hours after you exercise. If you have any of these problems, stop exercising right away. Do not do the exercises again unless your health care provider says that you can. Get help right away if:  You develop sudden, severe neck pain. If this happens, stop exercising right away. Do not do the exercises again unless your health care provider says that you can. This information is not intended to replace advice given to you by your health care provider. Make sure you discuss any questions you have with your health care provider. Document Revised: 01/01/2018 Document Reviewed: 01/01/2018 Elsevier Patient Education  2021 [employer].

## 2020-04-27 NOTE — Progress Notes (Signed)
Subjective:  Patient ID: Kristin Hinton, female    DOB: Aug 02, 1945  Age: 75 y.o. MRN: [id number]  CC: Acute Visit (Pt c/o left shoulder pain x 3 days. Pain travels from shoulder into her head. Pt has took [employer] and has been using a heating pad with only slight relief. )   Shoulder Pain  The pain is present in the neck and left shoulder. This is a new problem. The current episode started in the past 7 days. There has been no history of extremity trauma. The problem occurs constantly. The problem has been unchanged. The quality of the pain is described as aching. The pain is moderate. Associated symptoms include a limited range of motion and stiffness. Pertinent negatives include no fever, inability to bear weight, itching, joint locking, joint swelling, numbness or tingling. Exacerbated by: sleep position. She has tried heat, acetaminophen and NSAIDS for the symptoms. The treatment provided moderate relief. Family history does not include gout or rheumatoid arthritis. There is no history of diabetes, gout, osteoarthritis or rheumatoid arthritis.   Reviewed past Medical, Social and Family history today.  Outpatient Medications Prior to Visit  Medication Sig Dispense Refill  . estradiol (CLIMARA - DOSED IN MG/24 HR) 0.075 mg/24hr patch Place 1 patch (0.075 mg total) onto the skin once a week. 12 patch 3  . fluticasone (FLONASE) 50 MCG/ACT nasal spray Place 2 sprays into both nostrils daily. 48 g 0  . pantoprazole (PROTONIX) 40 MG tablet Take 1 tablet (40 mg total) by mouth 2 (two) times daily before a meal. 180 tablet 3  . triamcinolone cream (KENALOG) 0.1 % Apply 1 application topically 2 (two) times daily. 30 g 0  . propranolol ER (INDERAL LA) 60 MG 24 hr capsule TAKE 1 CAPSULE DAILY 90 capsule 3   No facility-administered medications prior to visit.    ROS See HPI  Objective:  BP 116/64 (BP Location: Right Arm, Patient Position: Sitting, Cuff Size: Normal)   Pulse 61   Temp (!)  96.7 F (35.9 C) (Temporal)   Ht 5' 6.5" (1.689 m)   Wt 120 lb 12.8 oz (54.8 kg)   SpO2 96%   BMI 19.21 kg/m   Physical Exam Vitals reviewed.  Constitutional:      General: She is not in acute distress. Neck:     Thyroid: No thyroid mass, thyromegaly or thyroid tenderness.  Pulmonary:     Effort: Pulmonary effort is normal.  Musculoskeletal:     Right shoulder: Normal.     Left shoulder: Normal.     Cervical back: Tenderness present. No edema, erythema, rigidity, torticollis or crepitus. Pain with movement and muscular tenderness present. Decreased range of motion.  Lymphadenopathy:     Cervical: No cervical adenopathy.  Neurological:     Mental Status: She is alert and oriented to person, place, and time.    Assessment & Plan:  This visit occurred during the SARS-CoV-2 public health emergency.  Safety protocols were in place, including screening questions prior to the visit, additional usage of staff PPE, and extensive cleaning of exam room while observing appropriate contact time as indicated for disinfecting solutions.   Kristin Hinton was seen today for acute visit.  Diagnoses and all orders for this visit:  Neck muscle strain, initial encounter -     tizanidine (ZANAFLEX) 2 MG capsule; Take 1-2 capsules (2-4 mg total) by mouth at bedtime as needed for muscle spasms.  Alternate between warm and cold compress as needed. Continue use of aleve  220mg  1-2tabs every 12hrs x 5days, with food. Take zanaflex at bedtime due to risk of sedation. Call office if no complete resolution in 1week or if you develop any new symtptoms  Problem List Items Addressed This Visit   None   Visit Diagnoses    Neck muscle strain, initial encounter    -  Primary   Relevant Medications   tizanidine (ZANAFLEX) 2 MG capsule      Follow-up: Return if symptoms worsen or fail to improve.  Wilfred Lacy, NP

## 2020-04-29 ENCOUNTER — Encounter: Payer: Self-pay | Admitting: Physician Assistant

## 2020-04-29 ENCOUNTER — Other Ambulatory Visit: Payer: Self-pay

## 2020-04-29 ENCOUNTER — Ambulatory Visit (INDEPENDENT_AMBULATORY_CARE_PROVIDER_SITE_OTHER): Payer: Medicare Other | Admitting: Physician Assistant

## 2020-04-29 VITALS — BP 130/70 | HR 58 | Ht 66.5 in | Wt 117.0 lb

## 2020-04-29 DIAGNOSIS — I471 Supraventricular tachycardia: Secondary | ICD-10-CM | POA: Diagnosis not present

## 2020-04-29 DIAGNOSIS — R002 Palpitations: Secondary | ICD-10-CM | POA: Diagnosis not present

## 2020-04-29 MED ORDER — PROPRANOLOL HCL ER 60 MG PO CP24: 60.0000 mg | ORAL_CAPSULE | Freq: Every day | ORAL | 3 refills | Status: DC

## 2020-04-29 NOTE — Patient Instructions (Signed)
Medication Instructions:  ? ?Your physician recommends that you continue on your current medications as directed. Please refer to the Current Medication list given to you today. ? ?*If you need a refill on your cardiac medications before your next appointment, please call your pharmacy* ? ? ?Lab Work: NONE ORDERED  TODAY ? ? ?If you have labs (blood work) drawn today and your tests are completely normal, you will receive your results only by: ?MyChart Message (if you have MyChart) OR ?A paper copy in the mail ?If you have any lab test that is abnormal or we need to change your treatment, we will call you to review the results. ? ? ?Testing/Procedures: NONE ORDERED  TODAY ? ? ? ?Follow-Up: ?At [facility], you and your health needs are our priority.  As part of our continuing mission to provide you with exceptional heart care, we have created designated Provider Care Teams.  These Care Teams include your primary Cardiologist (physician) and Advanced Practice Providers (APPs -  Physician Assistants and Nurse Practitioners) who all work together to provide you with the care you need, when you need it. ? ?We recommend signing up for the patient portal called "MyChart".  Sign up information is provided on this After Visit Summary.  MyChart is used to connect with patients for Virtual Visits (Telemedicine).  Patients are able to view lab/test results, encounter notes, upcoming appointments, etc.  Non-urgent messages can be sent to your provider as well.   ?To learn more about what you can do with MyChart, go to [email or web address].   ? ?Your next appointment:   ?1 year(s) ? ?The format for your next appointment:   ?In Person ? ?Provider:   ?You may see Dr. Klein or one of the following Advanced Practice Providers on your designated Care Team:   ?Renee Ursuy, PA-C ? ? ?Other Instructions ? ?

## 2020-04-29 NOTE — Progress Notes (Signed)
Cardiology Office Note Date:  04/29/2020  Patient ID:  Kristin Hinton, Kristin Hinton November 11, 1945, MRN [id number] PCP:  Ronnald Nian, DO  Cardiologist:  Dr. Oval Linsey Electrophysiologist; Dr. Caryl Comes   Chief Complaint:  annual EP f/u  History of Present Illness: Kristin Hinton is a 75 y.o. female with history of HLD, Schatzki's ring, LBBB, SVT    She had been fairly well controlled with low dose BB for her SVT though in the last year increasing frequency.  Her BB uptitrated though intolerant with fatigue, unable to exercise.  Changed to dilt, though BP did not tolerate titration.   She saw Dr. Caryl Comes 11/2017.  She was not interested in ablation at that time.  She was referred for a ZIO Patch that revealed mostly sinus rhythms with isolated PACs and a nonsustained atrial tachycardia with aberrancy.  He gave her prescriptions of propranolol, nebivolol, and bisoprolol. At her f/u with Dr. Oval Linsey she was on propanolol and doing a bit better though still with daily fleeting palpitations, her propanolol up-titrated slightly to avoid hypotension with plans to c/w EP going forward.  Dr. Caryl Comes reported in his note [facility] 2017 as SVT.  Cover sheet was reviewed and described SVT at 200 bpm with some aberration as well as SVT at 135 with possible atrial tachycardia.    I saw her Dec 2019, she was accompanied by her husband.  She tells me she could not recall life without palpitations, these date back to her 35's.  Her fatigue went back to then as well and had been attributed to her tachycardia.  She reported back in the beginning she would cough and it would stop, in the last several years this has been ineffective.  She has not tried any other vagal maneuvers.  She tried one of the other drug options that Dr. Caryl Comes gave her and it made her fatigue significantly worse, the other was not a covered drug for her.  She feels like the propanolol had perhaps slowed the rate of her tachycardia but not the  frequency of it.  Repored feeling her heart pounding out of her chest especially at night and most days feel like she has it.  She mentions as well episodes daily of feeling lightheaded, not near syncopal, but says if she were to be driving would pull over.  Unclear how long she has had these, but they are not associated with any awareness of her palpitations. No CP or SOB.  She was not sleeping well for a couple reasons, often with palpitations, wakes to use the bathroom, and sometimes just trouble sleeping.  She mentioned last she spoke with Dr. Caryl Comes they discussed ablation, and having her see D. Lovena Le but she was not quite ready, worries about procedures and risks.  Though felt like she may be getting to the point where she would like to hear more about the ablation option.  We planned for 48hr monitor given her daily lightheaded spells it seems post Zio monitor to evaluate firther, also discussed having her see Dr. Lovena Le, though does not look like she did. Monitor noted Atach, symptoms strips were SR, an no noted symptoms with the AT episodes, Dr. Caryl Comes recommended rate control  I saw her 04/28/19 She comes in feeling very well !  She is walking in the treadmill almost daily with good exertional capacity, no CP or SOB.  She says her palpitations have really settled down, no ongoing issues with dizzy spells, no near syncope or syncope She tells  me she is feeling very good and tolerating her propanolol well. She had labs done with her PMD in Nov or Dec and was told they were all "OK". No changes were made, planned for an annual visit.  TODAY Again her husband accompanies her and she has continued to do very well./ Minimal, far and few palpitations No CP, SOB No dizziness, near syncope or syncope. She remains very active, walking thetreadmill most days. No exertional intolerances.  Past Medical History:  Diagnosis Date  . History of chicken pox   . Hyperlipidemia   . LBBB (left bundle  branch block)   . Migraines   . Schatzki's ring   . SVT (supraventricular tachycardia) ([place])   . Tachycardia     Past Surgical History:  Procedure Laterality Date  . HYSTERECTOMY ABDOMINAL WITH SALPINGECTOMY  03/30/1991  . [place] SURGERY  03/19/1997  . MASTECTOMY Left    Due to surgical complications related to previous breast implant  . PLACEMENT OF BREAST IMPLANTS  03/19/2013  . TONSILLECTOMY  03/20/1983    Current Outpatient Medications  Medication Sig Dispense Refill  . estradiol (CLIMARA - DOSED IN MG/24 HR) 0.075 mg/24hr patch Place 1 patch (0.075 mg total) onto the skin once a week. 12 patch 3  . fluticasone (FLONASE) 50 MCG/ACT nasal spray Place 2 sprays into both nostrils daily. 48 g 0  . pantoprazole (PROTONIX) 40 MG tablet Take 1 tablet (40 mg total) by mouth 2 (two) times daily before a meal. 180 tablet 3  . tizanidine (ZANAFLEX) 2 MG capsule Take 1-2 capsules (2-4 mg total) by mouth at bedtime as needed for muscle spasms. 14 capsule 0  . triamcinolone cream (KENALOG) 0.1 % Apply 1 application topically 2 (two) times daily. 30 g 0  . propranolol ER (INDERAL LA) 60 MG 24 hr capsule Take 1 capsule (60 mg total) by mouth daily. 90 capsule 3   No current facility-administered medications for this visit.    Allergies:   Molds & smuts and Pollen extract   Social History:  The patient  reports that she has never smoked. She has quit using smokeless tobacco. She reports current alcohol use. She reports that she does not use drugs.   Family History:  The patient's family history includes Alcohol abuse in her brother and brother; Arthritis in her maternal grandmother, mother, and paternal grandmother; Breast cancer in her sister; Cancer in her brother and father; Congenital heart disease in her mother; Congestive Heart Failure in her mother; Drug abuse in her brother; Early death in her brother and brother; Hearing loss in her mother; Heart attack in her brother and paternal  grandmother; Hypercholesterolemia in her maternal grandmother, paternal grandmother, and son; Hyperlipidemia in her mother; Hypertension in her maternal grandmother, mother, paternal grandmother, and son; Kidney disease in her mother; Stroke in her maternal grandmother.  ROS:  Please see the history of present illness.  All other systems are reviewed and otherwise negative.   PHYSICAL EXAM:  VS:  BP 130/70 (BP Location: Left Arm, Patient Position: Sitting, Cuff Size: Normal)   Pulse (!) 58   Ht 5' 6.5" (1.689 m)   Wt 117 lb (53.1 kg)   SpO2 98%   BMI 18.60 kg/m  BMI: Body mass index is 18.6 kg/m. Well nourished, well developed, in no acute distress, appears younger then her age 36: normocephalic, atraumatic  Neck: no JVD, carotid bruits or masses Cardiac:  RRR; no significant murmurs, no rubs, or gallops Lungs:  CTA b/l,  no wheezing, rhonchi or rales  Abd: soft, nontender MS: no deformity or atrophy Ext: no edema  Skin: warm and dry, no rash Neuro:  No gross deficits appreciated Psych: euthymic mood, full affect   EKG:  Done today an reviewed by myself;  SB 58bpm, unchanged from prior   Jan 2020: 48hr monitor Dominant Rhythm  Sinus  Mean HR 68 Range HR 51-137  Total beats 180 K PVC 0 PAC 122  Tachycardia MULTIPLE WITH RATE RELATED ABERRATION  Appears to be atrial tachycardia  Pauses none    Symptoms noted palpitations were associated with NSR  No symptoms reported with SVT     Rate control     Ziopatch 09/27/17:  Duration: 14d Findings Symptoms  Racing Fluttering >> sinus mostly but not always with isolated PAC Symptom unspecified  Nonsustained atrial tach (wide with aberration) 8 bts >>150 bpm  No symptoms atrial tach with variable conduction  AA interval about 400 msec with variable conduction   17 bts At night  Conclusions  Her daily symptoms of fluttering and racing is most consistent with Isolated PACs in sinus rhtyhm Some nonsustained atrial  tachy-- brief and relatively slow- much less frequent    2017 Zio patch SR PVCs/PACs 113 runs of WCT, suspect SVT with rate related abberancy  Echo 07/21/15: LVEF 55%. Mild MR/TR. RVSP 25  Cardiac CT-A 04/28/15: Limited by artifact engaging  Cardiac catheterization 08/01/15: Normal coronary arteries. LVEF 60%.    Recent Labs: No results found for requested labs within last 8760 hours.  No results found for requested labs within last 8760 hours.   CrCl cannot be calculated (Patient's most recent lab result is older than the maximum 21 days allowed.).   Wt Readings from Last 3 Encounters:  04/29/20 117 lb (53.1 kg)  04/27/20 120 lb 12.8 oz (54.8 kg)  09/09/19 114 lb (51.7 kg)     Other studies reviewed: Additional studies/records reviewed today include: summarized above  ASSESSMENT AND PLAN:  1. Palpitations 2. SVT      A Tach with abberations 3. PACs       Minimal, brief palpitations No changes    Disposition: we will continue annual visits      Current medicines are reviewed at length with the patient today.  The patient did not have any concerns regarding medicines.  Venetia Night, PA-C 04/29/2020 5:46 PM     [address] [place] [address] [phone] (office)  [phone] (fax)

## 2020-04-30 ENCOUNTER — Encounter: Payer: Self-pay | Admitting: Nurse Practitioner

## 2020-05-25 ENCOUNTER — Other Ambulatory Visit: Payer: Self-pay

## 2020-05-26 ENCOUNTER — Encounter: Payer: Self-pay | Admitting: Family Medicine

## 2020-05-26 ENCOUNTER — Ambulatory Visit (INDEPENDENT_AMBULATORY_CARE_PROVIDER_SITE_OTHER): Payer: Medicare Other | Admitting: Family Medicine

## 2020-05-26 VITALS — BP 130/76 | HR 61 | Temp 97.7°F | Ht 66.5 in | Wt 117.2 lb

## 2020-05-26 DIAGNOSIS — M7632 Iliotibial band syndrome, left leg: Secondary | ICD-10-CM

## 2020-05-26 NOTE — Patient Instructions (Addendum)
Heating pad 2x/day - 15-73min on then off Do exercises at least once day Take aleve twice per day with food x 5 days Can use muscle relaxant if needed  Iliotibial Band Syndrome Rehab Ask your health care provider which exercises are safe for you. Do exercises exactly as told by your health care provider and adjust them as directed. It is normal to feel mild stretching, pulling, tightness, or discomfort as you do these exercises. Stop right away if you feel sudden pain or your pain gets significantly worse. Do not begin these exercises until told by your health care provider. Stretching and range-of-motion exercises These exercises warm up your muscles and joints and improve the movement and flexibility of your hip and pelvis. Quadriceps stretch, prone 1. Lie on your abdomen (prone position) on a firm surface, such as a bed or padded floor. 2. Bend your left / right knee and reach back to hold your ankle or pant leg. If you cannot reach your ankle or pant leg, loop a belt around your foot and grab the belt instead. 3. Gently pull your heel toward your buttocks. Your knee should not slide out to the side. You should feel a stretch in the front of your thigh and knee (quadriceps). 4. Hold this position for __________ seconds. Repeat __________ times. Complete this exercise __________ times a day.   Iliotibial band stretch An iliotibial band is a strong band of muscle tissue that runs from the outer side of your hip to the outer side of your thigh and knee. 1. Lie on your side with your left / right leg in the top position. 2. Bend both of your knees and grab your left / right ankle. Stretch out your bottom arm to help you balance. 3. Slowly bring your top knee back so your thigh goes behind your trunk. 4. Slowly lower your top leg toward the floor until you feel a gentle stretch on the outside of your left / right hip and thigh. If you do not feel a stretch and your knee will not fall farther, place  the heel of your other foot on top of your knee and pull your knee down toward the floor with your foot. 5. Hold this position for __________ seconds. Repeat __________ times. Complete this exercise __________ times a day.   Strengthening exercises These exercises build strength and endurance in your hip and pelvis. Endurance is the ability to use your muscles for a long time, even after they get tired. Straight leg raises, side-lying This exercise strengthens the muscles that rotate the leg at the hip and move it away from your body (hip abductors). 1. Lie on your side with your left / right leg in the top position. Lie so your head, shoulder, hip, and knee line up. You may bend your bottom knee to help you balance. 2. Roll your hips slightly forward so your hips are stacked directly over each other and your left / right knee is facing forward. 3. Tense the muscles in your outer thigh and lift your top leg 4-6 inches (10-15 cm). 4. Hold this position for __________ seconds. 5. Slowly lower your leg to return to the starting position. Let your muscles relax completely before doing another repetition. Repeat __________ times. Complete this exercise __________ times a day.   Leg raises, prone This exercise strengthens the muscles that move the hips backward (hip extensors). 1. Lie on your abdomen (prone position) on your bed or a firm surface. You can  put a pillow under your hips if that is more comfortable for your lower back. 2. Bend your left / right knee so your foot is straight up in the air. 3. Squeeze your buttocks muscles and lift your left / right thigh off the bed. Do not let your back arch. 4. Tense your thigh muscle as hard as you can without increasing any knee pain. 5. Hold this position for __________ seconds. 6. Slowly lower your leg to return to the starting position and allow it to relax completely. Repeat __________ times. Complete this exercise __________ times a day. Hip  hike 1. Stand sideways on a bottom step. Stand on your left / right leg with your other foot unsupported next to the step. You can hold on to a railing or wall for balance if needed. 2. Keep your knees straight and your torso square. Then lift your left / right hip up toward the ceiling. 3. Slowly let your left / right hip lower toward the floor, past the starting position. Your foot should get closer to the floor. Do not lean or bend your knees. Repeat __________ times. Complete this exercise __________ times a day. This information is not intended to replace advice given to you by your health care provider. Make sure you discuss any questions you have with your health care provider. Document Revised: 05/13/2019 Document Reviewed: 05/13/2019 Elsevier Patient Education  [place].

## 2020-05-26 NOTE — Progress Notes (Signed)
Kristin Hinton is a 75 y.o. female  Chief Complaint  Patient presents with  . Leg Pain    Pt c/o upper lt leg pain, worsening over the last 4 weeks.  Pt not taking anything for it.    HPI: Kristin Hinton is a 74 y.o. female who complains of Lt lateral thigh pain x 4 wks. No/minimal improvement. Worst first thing in AM and when she wakes up in the middle of the night after being asleep for a few hours. She describes as achy/throbbing. No numbness or tingling. No weakness. No swelling.  Pt has been walking on treadmill and no pain at that time. Pt walks on treadmill, yoga, pilates, squats.  She has not taken any med for it.    Past Medical History:  Diagnosis Date  . History of chicken pox   . Hyperlipidemia   . LBBB (left bundle branch block)   . Migraines   . Schatzki's ring   . SVT (supraventricular tachycardia) ([place])   . Tachycardia     Past Surgical History:  Procedure Laterality Date  . HYSTERECTOMY ABDOMINAL WITH SALPINGECTOMY  03/30/1991  . [place] SURGERY  03/19/1997  . MASTECTOMY Left    Due to surgical complications related to previous breast implant  . PLACEMENT OF BREAST IMPLANTS  03/19/2013  . TONSILLECTOMY  03/20/1983    Social History   Socioeconomic History  . Marital status: Married    Spouse name: Not on file  . Number of children: Not on file  . Years of education: Not on file  . Highest education level: Not on file  Occupational History  . Occupation: Retired  Tobacco Use  . Smoking status: Never Smoker  . Smokeless tobacco: Former Network engineer  . Vaping Use: Never used  Substance and Sexual Activity  . Alcohol use: Yes    Comment: 7-10 drinks per week  . Drug use: Never  . Sexual activity: Not on file  Other Topics Concern  . Not on file  Social History Narrative  . Not on file   Social Determinants of Health   Financial Resource Strain: Low Risk   . Difficulty of Paying Living Expenses: Not hard at all  Food Insecurity:  No Food Insecurity  . Worried About Charity fundraiser in the Last Year: Never true  . Ran Out of Food in the Last Year: Never true  Transportation Needs: No Transportation Needs  . Lack of Transportation (Medical): No  . Lack of Transportation (Non-Medical): No  Physical Activity: Sufficiently Active  . Days of Exercise per Week: 7 days  . Minutes of Exercise per Session: 30 min  Stress: No Stress Concern Present  . Feeling of Stress : Not at all  Social Connections: Moderately Isolated  . Frequency of Communication with Friends and Family: More than three times a week  . Frequency of Social Gatherings with Friends and Family: Once a week  . Attends Religious Services: Never  . Active Member of Clubs or Organizations: No  . Attends Archivist Meetings: Never  . Marital Status: Married  Human resources officer Violence: Not At Risk  . Fear of Current or Ex-Partner: No  . Emotionally Abused: No  . Physically Abused: No  . Sexually Abused: No    Family History  Problem Relation Age of Onset  . Congenital heart disease Mother   . Congestive Heart Failure Mother   . Hyperlipidemia Mother   . Hypertension Mother   . Kidney  disease Mother   . Arthritis Mother   . Hearing loss Mother   . Cancer Father        Lung  . Breast cancer Sister   . Cancer Brother   . Alcohol abuse Brother   . Drug abuse Brother   . Early death Brother   . Heart attack Brother   . Stroke Maternal Grandmother   . Arthritis Maternal Grandmother   . Hypercholesterolemia Maternal Grandmother   . Hypertension Maternal Grandmother   . Arthritis Paternal Grandmother   . Heart attack Paternal Grandmother   . Hypercholesterolemia Paternal Grandmother   . Hypertension Paternal Grandmother   . Alcohol abuse Brother   . Early death Brother   . Hypercholesterolemia Son   . Hypertension Son      Immunization History  Administered Date(s) Administered  . Fluad Quad(high Dose 65+) 12/24/2018  . H1N1  01/22/2008  . Influenza, High Dose Seasonal PF 02/16/2005, 02/21/2006, 01/09/2007, 11/27/2007, 01/22/2008, 11/15/2008, 11/09/2009, 12/30/2010, 12/22/2012, 12/23/2013, 01/07/2015, 12/19/2015, 12/25/2016, 12/05/2017  . Influenza-Unspecified 12/17/2019  . PFIZER(Purple Top)SARS-COV-2 Vaccination 05/12/2019, 06/02/2019  . Pneumococcal Conjugate-13 04/27/2013  . Pneumococcal Polysaccharide-23 06/25/2011  . Tdap 11/09/2009, 07/18/2010  . Zoster 10/22/2007  . Zoster Recombinat (Shingrix) 01/30/2017    Outpatient Encounter Medications as of 05/26/2020  Medication Sig  . estradiol (CLIMARA - DOSED IN MG/24 HR) 0.075 mg/24hr patch Place 1 patch (0.075 mg total) onto the skin once a week.  . fluticasone (FLONASE) 50 MCG/ACT nasal spray Place 2 sprays into both nostrils daily.  . pantoprazole (PROTONIX) 40 MG tablet Take 1 tablet (40 mg total) by mouth 2 (two) times daily before a meal.  . propranolol ER (INDERAL LA) 60 MG 24 hr capsule Take 1 capsule (60 mg total) by mouth daily.  Marland Kitchen triamcinolone cream (KENALOG) 0.1 % Apply 1 application topically 2 (two) times daily.  . tizanidine (ZANAFLEX) 2 MG capsule Take 1-2 capsules (2-4 mg total) by mouth at bedtime as needed for muscle spasms. (Patient not taking: Reported on 05/26/2020)   No facility-administered encounter medications on file as of 05/26/2020.     ROS: Pertinent positives and negatives noted in HPI. Remainder of ROS non-contributory    Allergies  Allergen Reactions  . Molds & Smuts Itching  . Pollen Extract Itching    BP 130/76 (BP Location: Left Arm, Patient Position: Sitting, Cuff Size: Normal)   Pulse 61   Temp 97.7 F (36.5 C) (Temporal)   Ht 5' 6.5" (1.689 m)   Wt 117 lb 3.2 oz (53.2 kg)   SpO2 100%   BMI 18.63 kg/m   Wt Readings from Last 3 Encounters:  05/26/20 117 lb 3.2 oz (53.2 kg)  04/29/20 117 lb (53.1 kg)  04/27/20 120 lb 12.8 oz (54.8 kg)   Temp Readings from Last 3 Encounters:  05/26/20 97.7 F (36.5 C)  (Temporal)  04/27/20 (!) 96.7 F (35.9 C) (Temporal)  08/05/19 (!) 97.3 F (36.3 C) (Tympanic)   BP Readings from Last 3 Encounters:  05/26/20 130/76  04/29/20 130/70  04/27/20 116/64   Pulse Readings from Last 3 Encounters:  05/26/20 61  04/29/20 (!) 58  04/27/20 61     Physical Exam Constitutional:      General: She is not in acute distress.    Appearance: Normal appearance. She is normal weight. She is not ill-appearing.  Musculoskeletal:     Left hip: No tenderness, bony tenderness or crepitus. Normal range of motion. Normal strength.     Right lower  leg: No edema.     Left lower leg: No edema.       Legs:     Comments: TTP Lt lateral thigh; no edema, erythema   Neurological:     Mental Status: She is alert and oriented to person, place, and time.     Sensory: No sensory deficit.     Motor: No weakness.     Coordination: Coordination normal.     Gait: Gait normal.  Psychiatric:        Mood and Affect: Mood normal.        Behavior: Behavior normal.      A/P:  1. It band syndrome, left - ice/heat BID - exercises (included in AVS) daily - naprosyn BID w/ food x 5-7 days  - no treadmill x 1 wk - f/u if symptoms worsen or do not improve in 2wks   This visit occurred during the SARS-CoV-2 public health emergency.  Safety protocols were in place, including screening questions prior to the visit, additional usage of staff PPE, and extensive cleaning of exam room while observing appropriate contact time as indicated for disinfecting solutions.

## 2020-05-31 ENCOUNTER — Other Ambulatory Visit: Payer: Self-pay | Admitting: Family Medicine

## 2020-06-10 ENCOUNTER — Ambulatory Visit (INDEPENDENT_AMBULATORY_CARE_PROVIDER_SITE_OTHER): Payer: Medicare Other | Admitting: Family Medicine

## 2020-06-10 ENCOUNTER — Ambulatory Visit (INDEPENDENT_AMBULATORY_CARE_PROVIDER_SITE_OTHER): Payer: Medicare Other

## 2020-06-10 ENCOUNTER — Other Ambulatory Visit: Payer: Self-pay

## 2020-06-10 ENCOUNTER — Encounter: Payer: Self-pay | Admitting: Family Medicine

## 2020-06-10 VITALS — BP 100/70 | HR 63 | Temp 97.0°F | Ht 66.5 in | Wt 117.8 lb

## 2020-06-10 DIAGNOSIS — M25552 Pain in left hip: Secondary | ICD-10-CM | POA: Diagnosis not present

## 2020-06-10 DIAGNOSIS — M79605 Pain in left leg: Secondary | ICD-10-CM | POA: Diagnosis not present

## 2020-06-10 DIAGNOSIS — M79652 Pain in left thigh: Secondary | ICD-10-CM | POA: Diagnosis not present

## 2020-06-10 DIAGNOSIS — M47816 Spondylosis without myelopathy or radiculopathy, lumbar region: Secondary | ICD-10-CM | POA: Diagnosis not present

## 2020-06-10 NOTE — Progress Notes (Signed)
Kristin Hinton is a 75 y.o. female  Chief Complaint  Patient presents with  . Leg Pain    Pt c/o still having some lt leg pain, tenderness. Pt said that yesterday it really was hurting to where she didn't move much. Not every day but most days.  Pt has been using a heating pad on leg.    HPI: Kristin Hinton is a 75 y.o. female who complains of persistent Lt upper leg pain. I saw pt for this on 05/26/20 and felt symptoms were d/t IT band syndrome and the plan at that time was to was treat conservatively with naprosyn BID, daily exercises, heating pad, and avoid walking on treadmill x 1 week. Today she reports she is still having pain, not every day but most days. 2 days ago pain was terrible so pt did not do/move much. No swelling. No redness. No warmth to touch. No ecchymosis. No weakness.  She describes pain as a "hurt like someone hit me with a hammer".  She walked yesterday on treadmill x 30 min and no pain yesterday or today.   Past Medical History:  Diagnosis Date  . History of chicken pox   . Hyperlipidemia   . LBBB (left bundle branch block)   . Migraines   . Schatzki's ring   . SVT (supraventricular tachycardia) ([place])   . Tachycardia     Past Surgical History:  Procedure Laterality Date  . HYSTERECTOMY ABDOMINAL WITH SALPINGECTOMY  03/30/1991  . [place] SURGERY  03/19/1997  . MASTECTOMY Left    Due to surgical complications related to previous breast implant  . PLACEMENT OF BREAST IMPLANTS  03/19/2013  . TONSILLECTOMY  03/20/1983    Social History   Socioeconomic History  . Marital status: Married    Spouse name: Not on file  . Number of children: Not on file  . Years of education: Not on file  . Highest education level: Not on file  Occupational History  . Occupation: Retired  Tobacco Use  . Smoking status: Never Smoker  . Smokeless tobacco: Former Network engineer  . Vaping Use: Never used  Substance and Sexual Activity  . Alcohol use: Yes     Comment: 7-10 drinks per week  . Drug use: Never  . Sexual activity: Not on file  Other Topics Concern  . Not on file  Social History Narrative  . Not on file   Social Determinants of Health   Financial Resource Strain: Low Risk   . Difficulty of Paying Living Expenses: Not hard at all  Food Insecurity: No Food Insecurity  . Worried About Charity fundraiser in the Last Year: Never true  . Ran Out of Food in the Last Year: Never true  Transportation Needs: No Transportation Needs  . Lack of Transportation (Medical): No  . Lack of Transportation (Non-Medical): No  Physical Activity: Sufficiently Active  . Days of Exercise per Week: 7 days  . Minutes of Exercise per Session: 30 min  Stress: No Stress Concern Present  . Feeling of Stress : Not at all  Social Connections: Moderately Isolated  . Frequency of Communication with Friends and Family: More than three times a week  . Frequency of Social Gatherings with Friends and Family: Once a week  . Attends Religious Services: Never  . Active Member of Clubs or Organizations: No  . Attends Archivist Meetings: Never  . Marital Status: Married  Human resources officer Violence: Not At Risk  .  Fear of Current or Ex-Partner: No  . Emotionally Abused: No  . Physically Abused: No  . Sexually Abused: No    Family History  Problem Relation Age of Onset  . Congenital heart disease Mother   . Congestive Heart Failure Mother   . Hyperlipidemia Mother   . Hypertension Mother   . Kidney disease Mother   . Arthritis Mother   . Hearing loss Mother   . Cancer Father        Lung  . Breast cancer Sister   . Cancer Brother   . Alcohol abuse Brother   . Drug abuse Brother   . Early death Brother   . Heart attack Brother   . Stroke Maternal Grandmother   . Arthritis Maternal Grandmother   . Hypercholesterolemia Maternal Grandmother   . Hypertension Maternal Grandmother   . Arthritis Paternal Grandmother   . Heart attack Paternal  Grandmother   . Hypercholesterolemia Paternal Grandmother   . Hypertension Paternal Grandmother   . Alcohol abuse Brother   . Early death Brother   . Hypercholesterolemia Son   . Hypertension Son      Immunization History  Administered Date(s) Administered  . Fluad Quad(high Dose 65+) 12/24/2018  . H1N1 01/22/2008  . Influenza, High Dose Seasonal PF 02/16/2005, 02/21/2006, 01/09/2007, 11/27/2007, 01/22/2008, 11/15/2008, 11/09/2009, 12/30/2010, 12/22/2012, 12/23/2013, 01/07/2015, 12/19/2015, 12/25/2016, 12/05/2017  . Influenza-Unspecified 12/17/2019  . PFIZER(Purple Top)SARS-COV-2 Vaccination 05/12/2019, 06/02/2019  . Pneumococcal Conjugate-13 04/27/2013  . Pneumococcal Polysaccharide-23 06/25/2011  . Tdap 11/09/2009, 07/18/2010  . Zoster 10/22/2007  . Zoster Recombinat (Shingrix) 01/30/2017    Outpatient Encounter Medications as of 06/10/2020  Medication Sig  . estradiol (VIVELLE-DOT) 0.075 MG/24HR APPLY 1 PATCH ONTO THE SKIN TWICE A WEEK  . fluticasone (FLONASE) 50 MCG/ACT nasal spray Place 2 sprays into both nostrils daily.  Marland Kitchen ibuprofen (ADVIL) 200 MG tablet Take 200 mg by mouth every 6 (six) hours as needed.  . pantoprazole (PROTONIX) 40 MG tablet Take 1 tablet (40 mg total) by mouth 2 (two) times daily before a meal.  . propranolol ER (INDERAL LA) 60 MG 24 hr capsule Take 1 capsule (60 mg total) by mouth daily.  Marland Kitchen triamcinolone cream (KENALOG) 0.1 % Apply 1 application topically 2 (two) times daily.  Marland Kitchen HYDROcodone-acetaminophen (NORCO/VICODIN) 5-325 MG tablet TAKE ONE TABLET BY MOUTH EVERY 4 TO 6 HOURS AS NEEDED FOR PAIN (Patient not taking: Reported on 06/10/2020)  . [DISCONTINUED] tizanidine (ZANAFLEX) 2 MG capsule Take 1-2 capsules (2-4 mg total) by mouth at bedtime as needed for muscle spasms. (Patient not taking: Reported on 05/26/2020)   No facility-administered encounter medications on file as of 06/10/2020.     ROS: Pertinent positives and negatives noted in HPI.  Remainder of ROS non-contributory   Allergies  Allergen Reactions  . Molds & Smuts Itching  . Pollen Extract Itching    BP 100/70 (BP Location: Left Arm, Patient Position: Sitting, Cuff Size: Normal)   Pulse 63   Temp (!) 97 F (36.1 C)   Ht 5' 6.5" (1.689 m)   Wt 117 lb 12.8 oz (53.4 kg)   BMI 18.73 kg/m   Wt Readings from Last 3 Encounters:  06/10/20 117 lb 12.8 oz (53.4 kg)  05/26/20 117 lb 3.2 oz (53.2 kg)  04/29/20 117 lb (53.1 kg)   Temp Readings from Last 3 Encounters:  06/10/20 (!) 97 F (36.1 C)  05/26/20 97.7 F (36.5 C) (Temporal)  04/27/20 (!) 96.7 F (35.9 C) (Temporal)   BP Readings  from Last 3 Encounters:  06/10/20 100/70  05/26/20 130/76  04/29/20 130/70   Pulse Readings from Last 3 Encounters:  06/10/20 63  05/26/20 61  04/29/20 (!) 58     Physical Exam Constitutional:      General: She is not in acute distress.    Appearance: Normal appearance. She is not ill-appearing.  Musculoskeletal:     Right lower leg: No edema.     Left lower leg: Tenderness present. No swelling, deformity or bony tenderness. No edema.       Legs:  Neurological:     Mental Status: She is alert and oriented to person, place, and time.     Sensory: No sensory deficit.     Motor: No weakness.     Coordination: Coordination normal.     Gait: Gait normal.  Psychiatric:        Mood and Affect: Mood normal.        Behavior: Behavior normal.      A/P:  1. Leg pain, left 2. Left hip pain - ongoing x 3-4 wks, not getting worse but not getting better despite naprosyn or ibuprofen BID, heat. Pt is able to walk on treadmill w/o discomfort/pain. She did stop this for a week but no change in frequency/intensity of pain - ?bursitis, OA hip w/ radiated pain, other - DG FEMUR MIN 2 VIEWS LEFT - DG Hip Unilat W OR W/O Pelvis 2-3 Views Left - Ambulatory referral to Sports Medicine   This visit occurred during the SARS-CoV-2 public health emergency.  Safety protocols were  in place, including screening questions prior to the visit, additional usage of staff PPE, and extensive cleaning of exam room while observing appropriate contact time as indicated for disinfecting solutions.

## 2020-06-22 ENCOUNTER — Ambulatory Visit (INDEPENDENT_AMBULATORY_CARE_PROVIDER_SITE_OTHER): Payer: Medicare Other | Admitting: Family Medicine

## 2020-06-22 ENCOUNTER — Other Ambulatory Visit: Payer: Self-pay

## 2020-06-22 VITALS — BP 128/70 | Ht 66.5 in | Wt 115.0 lb

## 2020-06-22 DIAGNOSIS — M5416 Radiculopathy, lumbar region: Secondary | ICD-10-CM | POA: Insufficient documentation

## 2020-06-22 NOTE — Progress Notes (Signed)
Kristin Hinton - 75 y.o. female MRN [id number]  Date of birth: 05/01/1945  SUBJECTIVE:  Including CC & ROS.  No chief complaint on file.   Kristin Hinton is a 75 y.o. female that is presenting with left lateral leg pain.  The pain has been ongoing for a few weeks.  Denies any new or different activities.  Pain has gotten some improvement here recently..  Independent review left hip x-ray from 3/27 shows significant joint changes.   Review of Systems See HPI   HISTORY: Past Medical, Surgical, Social, and Family History Reviewed & Updated per EMR.   Pertinent Historical Findings include:  Past Medical History:  Diagnosis Date  . History of chicken pox   . Hyperlipidemia   . LBBB (left bundle branch block)   . Migraines   . Schatzki's ring   . SVT (supraventricular tachycardia) ([place])   . Tachycardia     Past Surgical History:  Procedure Laterality Date  . HYSTERECTOMY ABDOMINAL WITH SALPINGECTOMY  03/30/1991  . [place] SURGERY  03/19/1997  . MASTECTOMY Left    Due to surgical complications related to previous breast implant  . PLACEMENT OF BREAST IMPLANTS  03/19/2013  . TONSILLECTOMY  03/20/1983    Family History  Problem Relation Age of Onset  . Congenital heart disease Mother   . Congestive Heart Failure Mother   . Hyperlipidemia Mother   . Hypertension Mother   . Kidney disease Mother   . Arthritis Mother   . Hearing loss Mother   . Cancer Father        Lung  . Breast cancer Sister   . Cancer Brother   . Alcohol abuse Brother   . Drug abuse Brother   . Early death Brother   . Heart attack Brother   . Stroke Maternal Grandmother   . Arthritis Maternal Grandmother   . Hypercholesterolemia Maternal Grandmother   . Hypertension Maternal Grandmother   . Arthritis Paternal Grandmother   . Heart attack Paternal Grandmother   . Hypercholesterolemia Paternal Grandmother   . Hypertension Paternal Grandmother   . Alcohol abuse Brother   . Early death Brother    . Hypercholesterolemia Son   . Hypertension Son     Social History   Socioeconomic History  . Marital status: Married    Spouse name: Not on file  . Number of children: Not on file  . Years of education: Not on file  . Highest education level: Not on file  Occupational History  . Occupation: Retired  Tobacco Use  . Smoking status: Never Smoker  . Smokeless tobacco: Former Network engineer  . Vaping Use: Never used  Substance and Sexual Activity  . Alcohol use: Yes    Comment: 7-10 drinks per week  . Drug use: Never  . Sexual activity: Not on file  Other Topics Concern  . Not on file  Social History Narrative  . Not on file   Social Determinants of Health   Financial Resource Strain: Low Risk   . Difficulty of Paying Living Expenses: Not hard at all  Food Insecurity: No Food Insecurity  . Worried About Charity fundraiser in the Last Year: Never true  . Ran Out of Food in the Last Year: Never true  Transportation Needs: No Transportation Needs  . Lack of Transportation (Medical): No  . Lack of Transportation (Non-Medical): No  Physical Activity: Sufficiently Active  . Days of Exercise per Week: 7 days  . Minutes of Exercise  per Session: 30 min  Stress: No Stress Concern Present  . Feeling of Stress : Not at all  Social Connections: Moderately Isolated  . Frequency of Communication with Friends and Family: More than three times a week  . Frequency of Social Gatherings with Friends and Family: Once a week  . Attends Religious Services: Never  . Active Member of Clubs or Organizations: No  . Attends Archivist Meetings: Never  . Marital Status: Married  Human resources officer Violence: Not At Risk  . Fear of Current or Ex-Partner: No  . Emotionally Abused: No  . Physically Abused: No  . Sexually Abused: No     PHYSICAL EXAM:  VS: BP 128/70   Ht 5' 6.5" (1.689 m)   Wt 115 lb (52.2 kg)   BMI 18.28 kg/m  Physical Exam Gen: NAD, alert, cooperative with  exam, well-appearing MSK:  Left hip: Normal range of motion. Weakness with hip abduction. No tenderness to palpation of the greater trochanter. Negative straight leg raise. Neurovascular intact     ASSESSMENT & PLAN:   Lumbar radiculopathy Symptoms seem more radicular in nature.  Seems less likely for bursitis at the hip with no pain on palpation today.  May have some SI joint involvement.  She does have a remote history of a vertebral fracture from several years ago. -Counseled home exercise therapy and supportive care. -Could consider injection or physical therapy -Could consider imaging of the lumbar spine.

## 2020-06-22 NOTE — Assessment & Plan Note (Signed)
Symptoms seem more radicular in nature.  Seems less likely for bursitis at the hip with no pain on palpation today.  May have some SI joint involvement.  She does have a remote history of a vertebral fracture from several years ago. -Counseled home exercise therapy and supportive care. -Could consider injection or physical therapy -Could consider imaging of the lumbar spine.

## 2020-06-22 NOTE — Patient Instructions (Signed)
Nice to meet you Please try the exercises  Please use tylenol if needed   Please send me a message in MyChart with any questions or updates.  Please see me back in 4 weeks.   --Dr. Raeford Razor

## 2020-07-14 ENCOUNTER — Other Ambulatory Visit: Payer: Self-pay | Admitting: Family Medicine

## 2020-07-20 ENCOUNTER — Other Ambulatory Visit: Payer: Self-pay

## 2020-07-20 ENCOUNTER — Ambulatory Visit (INDEPENDENT_AMBULATORY_CARE_PROVIDER_SITE_OTHER): Payer: Medicare Other | Admitting: Family Medicine

## 2020-07-20 ENCOUNTER — Ambulatory Visit: Payer: Self-pay

## 2020-07-20 VITALS — BP 122/70 | Ht 66.5 in | Wt 115.0 lb

## 2020-07-20 DIAGNOSIS — M25552 Pain in left hip: Secondary | ICD-10-CM | POA: Insufficient documentation

## 2020-07-20 MED ORDER — TRIAMCINOLONE ACETONIDE 40 MG/ML IJ SUSP
40.0000 mg | Freq: Once | INTRAMUSCULAR | Status: AC
  Administered 2020-07-20: 40 mg via INTRA_ARTICULAR

## 2020-07-20 NOTE — Patient Instructions (Signed)
Good to see you Please try ice as needed  Please only do that exercises that aren't causing pain  Please let me know if your pain returns.   Please send me a message in MyChart with any questions or updates.  Please see me back in 4 weeks.   --Dr. Raeford Razor

## 2020-07-20 NOTE — Assessment & Plan Note (Signed)
No improvement with conservative measures thus far.  Debating between trochanteric pain versus radicular pain.  Exercises do seem to make it worse and does have pain at night. -Counseled on home exercise therapy and supportive care. -Injection today. -Could consider gabapentin or physical therapy.

## 2020-07-20 NOTE — Progress Notes (Signed)
Kristin Hinton - 75 y.o. female MRN [id number]  Date of birth: October 17, 1945  SUBJECTIVE:  Including CC & ROS.  No chief complaint on file.   Kristin Hinton is a 75 y.o. female that is presenting with worsening of her left hip pain.  Pain is still occurring after 2 months.  Seems worse with certain exercises.   Review of Systems See HPI   HISTORY: Past Medical, Surgical, Social, and Family History Reviewed & Updated per EMR.   Pertinent Historical Findings include:  Past Medical History:  Diagnosis Date  . History of chicken pox   . Hyperlipidemia   . LBBB (left bundle branch block)   . Migraines   . Schatzki's ring   . SVT (supraventricular tachycardia) ([place])   . Tachycardia     Past Surgical History:  Procedure Laterality Date  . HYSTERECTOMY ABDOMINAL WITH SALPINGECTOMY  03/30/1991  . [place] SURGERY  03/19/1997  . MASTECTOMY Left    Due to surgical complications related to previous breast implant  . PLACEMENT OF BREAST IMPLANTS  03/19/2013  . TONSILLECTOMY  03/20/1983    Family History  Problem Relation Age of Onset  . Congenital heart disease Mother   . Congestive Heart Failure Mother   . Hyperlipidemia Mother   . Hypertension Mother   . Kidney disease Mother   . Arthritis Mother   . Hearing loss Mother   . Cancer Father        Lung  . Breast cancer Sister   . Cancer Brother   . Alcohol abuse Brother   . Drug abuse Brother   . Early death Brother   . Heart attack Brother   . Stroke Maternal Grandmother   . Arthritis Maternal Grandmother   . Hypercholesterolemia Maternal Grandmother   . Hypertension Maternal Grandmother   . Arthritis Paternal Grandmother   . Heart attack Paternal Grandmother   . Hypercholesterolemia Paternal Grandmother   . Hypertension Paternal Grandmother   . Alcohol abuse Brother   . Early death Brother   . Hypercholesterolemia Son   . Hypertension Son     Social History   Socioeconomic History  . Marital status: Married     Spouse name: Not on file  . Number of children: Not on file  . Years of education: Not on file  . Highest education level: Not on file  Occupational History  . Occupation: Retired  Tobacco Use  . Smoking status: Never Smoker  . Smokeless tobacco: Former Network engineer  . Vaping Use: Never used  Substance and Sexual Activity  . Alcohol use: Yes    Comment: 7-10 drinks per week  . Drug use: Never  . Sexual activity: Not on file  Other Topics Concern  . Not on file  Social History Narrative  . Not on file   Social Determinants of Health   Financial Resource Strain: Low Risk   . Difficulty of Paying Living Expenses: Not hard at all  Food Insecurity: No Food Insecurity  . Worried About Charity fundraiser in the Last Year: Never true  . Ran Out of Food in the Last Year: Never true  Transportation Needs: No Transportation Needs  . Lack of Transportation (Medical): No  . Lack of Transportation (Non-Medical): No  Physical Activity: Sufficiently Active  . Days of Exercise per Week: 7 days  . Minutes of Exercise per Session: 30 min  Stress: No Stress Concern Present  . Feeling of Stress : Not at all  Social  Connections: Moderately Isolated  . Frequency of Communication with Friends and Family: More than three times a week  . Frequency of Social Gatherings with Friends and Family: Once a week  . Attends Religious Services: Never  . Active Member of Clubs or Organizations: No  . Attends Archivist Meetings: Never  . Marital Status: Married  Human resources officer Violence: Not At Risk  . Fear of Current or Ex-Partner: No  . Emotionally Abused: No  . Physically Abused: No  . Sexually Abused: No     PHYSICAL EXAM:  VS: BP 122/70   Ht 5' 6.5" (1.689 m)   Wt 115 lb (52.2 kg)   BMI 18.28 kg/m  Physical Exam Gen: NAD, alert, cooperative with exam, well-appearing    Aspiration/Injection Procedure Note Yalissa Fink May 09, 1945  Procedure:  Injection Indications: Left hip pain  Procedure Details Consent: Risks of procedure as well as the alternatives and risks of each were explained to the (patient/caregiver).  Consent for procedure obtained. Time Out: Verified patient identification, verified procedure, site/side was marked, verified correct patient position, special equipment/implants available, medications/allergies/relevent history reviewed, required imaging and test results available.  Performed.  The area was cleaned with iodine and alcohol swabs.    The Left trochanteric bursa was injected using 4 cc of 1% lidocaine on a 22-gauge 3-1/2 inch needle.  The syringe was switched and a mixture containing 1 cc's of 40 mg Depo-Medrol and 4 cc's of 0.25% bupivacaine was injected.  Ultrasound was used. Images were obtained in short views showing the injection.     A sterile dressing was applied.  Patient did tolerate procedure well.      ASSESSMENT & PLAN:   Greater trochanteric pain syndrome of left lower extremity No improvement with conservative measures thus far.  Debating between trochanteric pain versus radicular pain.  Exercises do seem to make it worse and does have pain at night. -Counseled on home exercise therapy and supportive care. -Injection today. -Could consider gabapentin or physical therapy.

## 2020-07-25 ENCOUNTER — Encounter: Payer: Self-pay | Admitting: Family Medicine

## 2020-08-23 ENCOUNTER — Other Ambulatory Visit: Payer: Self-pay | Admitting: Family Medicine

## 2020-08-23 NOTE — Telephone Encounter (Signed)
Refill request for  Estradiol patch LR 05/31/20, #24, 0 rf LOV  06/10/20 FOV none scheduled.   Please review and advise.  Thanks.   Dm/cma

## 2020-08-25 ENCOUNTER — Other Ambulatory Visit: Payer: Self-pay

## 2020-08-25 ENCOUNTER — Ambulatory Visit (INDEPENDENT_AMBULATORY_CARE_PROVIDER_SITE_OTHER): Payer: Medicare Other | Admitting: Family Medicine

## 2020-08-25 ENCOUNTER — Ambulatory Visit: Payer: Self-pay

## 2020-08-25 VITALS — BP 92/60 | Ht 66.5 in | Wt 115.0 lb

## 2020-08-25 DIAGNOSIS — M24112 Other articular cartilage disorders, left shoulder: Secondary | ICD-10-CM

## 2020-08-25 DIAGNOSIS — M24159 Other articular cartilage disorders, unspecified hip: Secondary | ICD-10-CM | POA: Insufficient documentation

## 2020-08-25 DIAGNOSIS — M25552 Pain in left hip: Secondary | ICD-10-CM

## 2020-08-25 MED ORDER — MELOXICAM 7.5 MG PO TABS: 7.5000 mg | ORAL_TABLET | Freq: Two times a day (BID) | ORAL | 1 refills | Status: DC | PRN

## 2020-08-25 NOTE — Progress Notes (Addendum)
Kristin Hinton - 75 y.o. female MRN [id number]  Date of birth: 17-Apr-1945  SUBJECTIVE:  Including CC & ROS.  No chief complaint on file.   Kristin Hinton is a 75 y.o. female that is presenting with left anterior hip pain.  Pain is mild in nature and seems to be worse with hip flexion.  Has some radiation distally.  Feels different from the previous pain.  Did get significant improvement from the trochanteric injection..    Review of Systems See HPI   HISTORY: Past Medical, Surgical, Social, and Family History Reviewed & Updated per EMR.   Pertinent Historical Findings include:  Past Medical History:  Diagnosis Date   History of chicken pox    Hyperlipidemia    LBBB (left bundle branch block)    Migraines    Schatzki's ring    SVT (supraventricular tachycardia) ([facility])    Tachycardia     Past Surgical History:  Procedure Laterality Date   HYSTERECTOMY ABDOMINAL WITH SALPINGECTOMY  03/30/1991   LUMBAR DISC SURGERY  03/19/1997   MASTECTOMY Left    Due to surgical complications related to previous breast implant   PLACEMENT OF BREAST IMPLANTS  03/19/2013   TONSILLECTOMY  03/20/1983    Family History  Problem Relation Age of Onset   Congenital heart disease Mother    Congestive Heart Failure Mother    Hyperlipidemia Mother    Hypertension Mother    Kidney disease Mother    Arthritis Mother    Hearing loss Mother    Cancer Father        Lung   Breast cancer Sister    Cancer Brother    Alcohol abuse Brother    Drug abuse Brother    Early death Brother    Heart attack Brother    Stroke Maternal Grandmother    Arthritis Maternal Grandmother    Hypercholesterolemia Maternal Grandmother    Hypertension Maternal Grandmother    Arthritis Paternal Grandmother    Heart attack Paternal Grandmother    Hypercholesterolemia Paternal Grandmother    Hypertension Paternal Grandmother    Alcohol abuse Brother    Early death Brother    Hypercholesterolemia Son    Hypertension  Son     Social History   Socioeconomic History   Marital status: Married    Spouse name: Not on file   Number of children: Not on file   Years of education: Not on file   Highest education level: Not on file  Occupational History   Occupation: Retired  Tobacco Use   Smoking status: Never   Smokeless tobacco: Former  Scientific laboratory technician Use: Never used  Substance and Sexual Activity   Alcohol use: Yes    Comment: 7-10 drinks per week   Drug use: Never   Sexual activity: Not on file  Other Topics Concern   Not on file  Social History Narrative   Not on file   Social Determinants of Health   Financial Resource Strain: Not on file  Food Insecurity: Not on file  Transportation Needs: Not on file  Physical Activity: Not on file  Stress: Not on file  Social Connections: Not on file  Intimate Partner Violence: Not on file     PHYSICAL EXAM:  VS: BP 92/60   Ht 5' 6.5" (1.689 m)   Wt 115 lb (52.2 kg)   BMI 18.28 kg/m  Physical Exam Gen: NAD, alert, cooperative with exam, well-appearing MSK:  Left hip: Normal range of motion. No change  resistance. No significant tenderness palpation over the lateral trochanter. Neurovascular intact  Limited ultrasound: Left hip:  There appears to be degenerative changes and slight effusion of the labrum of the hip. No changes of the musculature overlying. No significant degenerative changes of the femoral head  Summary: Degenerative l labral changes of the left hip  Ultrasound and interpretation by Clearance Coots, MD    ASSESSMENT & PLAN:   Labral tear of hip, degenerative Has mild effusion on exam.  Seems to have more pain with hip flexion. -Counseled on home exercise therapy and supportive care. -Meloxicam. -Could consider physical therapy or injection if needed.

## 2020-08-25 NOTE — Patient Instructions (Signed)
Good to see you Please try ice  Please try the mobic twice daily for 4 days straight and then as needed   Please send me a message in MyChart with any questions or updates.  Please see [place] back as needed.   --Dr. Raeford Razor

## 2020-08-25 NOTE — Assessment & Plan Note (Signed)
Has mild effusion on exam.  Seems to have more pain with hip flexion. -Counseled on home exercise therapy and supportive care. -Meloxicam. -Could consider physical therapy or injection if needed.

## 2020-08-26 DIAGNOSIS — H43811 Vitreous degeneration, right eye: Secondary | ICD-10-CM | POA: Diagnosis not present

## 2020-08-26 DIAGNOSIS — H2513 Age-related nuclear cataract, bilateral: Secondary | ICD-10-CM | POA: Diagnosis not present

## 2020-09-14 ENCOUNTER — Ambulatory Visit: Payer: Medicare Other

## 2020-10-13 ENCOUNTER — Telehealth: Payer: Self-pay | Admitting: Family Medicine

## 2020-10-13 DIAGNOSIS — K222 Esophageal obstruction: Secondary | ICD-10-CM

## 2020-10-13 MED ORDER — PANTOPRAZOLE SODIUM 40 MG PO TBEC: 40.0000 mg | DELAYED_RELEASE_TABLET | Freq: Two times a day (BID) | ORAL | 1 refills | Status: DC

## 2020-10-13 NOTE — Telephone Encounter (Signed)
Chart supports Rx I

## 2020-10-13 NOTE — Telephone Encounter (Signed)
Pt requesting refill on pantoprazole (PROTONIX) 40 MG tablet [facility]

## 2020-10-17 ENCOUNTER — Other Ambulatory Visit: Payer: Self-pay

## 2020-10-17 ENCOUNTER — Encounter: Payer: Self-pay | Admitting: Family Medicine

## 2020-10-17 ENCOUNTER — Ambulatory Visit: Payer: Self-pay

## 2020-10-17 ENCOUNTER — Ambulatory Visit (INDEPENDENT_AMBULATORY_CARE_PROVIDER_SITE_OTHER): Payer: Medicare Other | Admitting: Family Medicine

## 2020-10-17 VITALS — BP 120/56 | Ht 66.5 in | Wt 115.0 lb

## 2020-10-17 DIAGNOSIS — M24159 Other articular cartilage disorders, unspecified hip: Secondary | ICD-10-CM | POA: Diagnosis not present

## 2020-10-17 NOTE — Assessment & Plan Note (Signed)
Symptoms still consistent with degenerative changes of the hip with an effusion.  Has gotten improvement since she has done home exercises. -Counseled on home exercise therapy and supportive care. -Pennsaid samples. -Could consider injection or physical therapy

## 2020-10-17 NOTE — Progress Notes (Signed)
Medication Samples have been provided to the patient.  Drug name: Pennsaid       Strength: 2%        Qty: 2 boxes  LOT: [id number]  Exp.Date: 09/2021  Dosing instructions: use a pea sized amount  The patient has been instructed regarding the correct time, dose, and frequency of taking this medication, including desired effects and most common side effects.   Kristin Hinton 11:57 AM 10/17/2020

## 2020-10-17 NOTE — Progress Notes (Signed)
Kristin Hinton - 74 y.o. female MRN [id number]  Date of birth: [birth year]  SUBJECTIVE:  Including CC & ROS.  No chief complaint on file.   Kristin Hinton is a 75 y.o. female that is following up for her left hip pain.  She has gotten improvement but notices the pain after she walks.  Pain is around the hip joint.  She did not take the meloxicam..    Review of Systems See HPI   HISTORY: Past Medical, Surgical, Social, and Family History Reviewed & Updated per EMR.   Pertinent Historical Findings include:  Past Medical History:  Diagnosis Date   History of chicken pox    Hyperlipidemia    LBBB (left bundle branch block)    Migraines    Schatzki's ring    SVT (supraventricular tachycardia) ([facility])    Tachycardia     Past Surgical History:  Procedure Laterality Date   HYSTERECTOMY ABDOMINAL WITH SALPINGECTOMY  03/30/1991   LUMBAR DISC SURGERY  03/19/1997   MASTECTOMY Left    Due to surgical complications related to previous breast implant   PLACEMENT OF BREAST IMPLANTS  03/19/2013   TONSILLECTOMY  03/20/1983    Family History  Problem Relation Age of Onset   Congenital heart disease Mother    Congestive Heart Failure Mother    Hyperlipidemia Mother    Hypertension Mother    Kidney disease Mother    Arthritis Mother    Hearing loss Mother    Cancer Father        Lung   Breast cancer Sister    Cancer Brother    Alcohol abuse Brother    Drug abuse Brother    Early death Brother    Heart attack Brother    Stroke Maternal Grandmother    Arthritis Maternal Grandmother    Hypercholesterolemia Maternal Grandmother    Hypertension Maternal Grandmother    Arthritis Paternal Grandmother    Heart attack Paternal Grandmother    Hypercholesterolemia Paternal Grandmother    Hypertension Paternal Grandmother    Alcohol abuse Brother    Early death Brother    Hypercholesterolemia Son    Hypertension Son     Social History   Socioeconomic History   Marital status:  Married    Spouse name: Not on file   Number of children: Not on file   Years of education: Not on file   Highest education level: Not on file  Occupational History   Occupation: Retired  Tobacco Use   Smoking status: Never   Smokeless tobacco: Former  Scientific laboratory technician Use: Never used  Substance and Sexual Activity   Alcohol use: Yes    Comment: 7-10 drinks per week   Drug use: Never   Sexual activity: Not on file  Other Topics Concern   Not on file  Social History Narrative   Not on file   Social Determinants of Health   Financial Resource Strain: Not on file  Food Insecurity: Not on file  Transportation Needs: Not on file  Physical Activity: Not on file  Stress: Not on file  Social Connections: Not on file  Intimate Partner Violence: Not on file     PHYSICAL EXAM:  VS: BP (!) 120/56 (BP Location: Left Arm, Patient Position: Sitting, Cuff Size: Normal)   Ht 5' 6.5" (1.689 m)   Wt 115 lb (52.2 kg)   BMI 18.28 kg/m  Physical Exam Gen: NAD, alert, cooperative with exam, well-appearing   Limited ultrasound: Left hip:  Effusion noted of the hip joint with changes of the labrum. Hyperemia associated with the muscles around the hip  Summary: Ongoing effusion with degenerative labral  Ultrasound and interpretation by Clearance Coots, MD     ASSESSMENT & PLAN:   Labral tear of hip, degenerative Symptoms still consistent with degenerative changes of the hip with an effusion.  Has gotten improvement since she has done home exercises. -Counseled on home exercise therapy and supportive care. -Pennsaid samples. -Could consider injection or physical therapy

## 2020-10-17 NOTE — Patient Instructions (Signed)
Good to see you Please try the rub on medicine  Please try ice after exercise  Please try the exercises and stretches   Please send me a message in MyChart with any questions or updates.  Please see me back in 4-6 weeks.   --Dr. Raeford Razor

## 2020-10-18 ENCOUNTER — Ambulatory Visit (INDEPENDENT_AMBULATORY_CARE_PROVIDER_SITE_OTHER): Payer: Medicare Other | Admitting: *Deleted

## 2020-10-18 DIAGNOSIS — Z0000 Encounter for general adult medical examination without abnormal findings: Secondary | ICD-10-CM

## 2020-10-18 NOTE — Progress Notes (Signed)
Subjective:   Kristin Hinton is a 75 y.o. female who presents for Medicare Annual (Subsequent) preventive examination.  I connected with  Kristin Hinton on 10/18/20 by a telephone enabled telemedicine application and verified that I am speaking with the correct person using two identifiers.   I discussed the limitations of evaluation and management by telemedicine. The patient expressed understanding and agreed to proceed.   Review of Systems    na Cardiac Risk Factors include: advanced age (>57mn, >>36women);dyslipidemia     Objective:    Today's Vitals   There is no height or weight on file to calculate BMI.  Advanced Directives 10/18/2020 09/09/2019 09/03/2018  Does Patient Have a Medical Advance Directive? Yes Yes Yes  Type of AParamed[place]of [place] will Living will  Does patient want to make changes to medical advance directive? - - No - Patient declined  Copy of HYellow Medicinein Chart? Yes - validated most recent copy scanned in chart (See row information) No - copy requested -    Current Medications (verified) Outpatient Encounter Medications as of 10/18/2020  Medication Sig   estradiol (VIVELLE-DOT) 0.075 MG/24HR APPLY 1 PATCH ONTO THE SKIN TWICE A WEEK   fluticasone (FLONASE) 50 MCG/ACT nasal spray USE 2 SPRAYS IN EACH NOSTRIL DAILY   meloxicam (MOBIC) 7.5 MG tablet Take 1 tablet (7.5 mg total) by mouth 2 (two) times daily as needed.   pantoprazole (PROTONIX) 40 MG tablet Take 1 tablet (40 mg total) by mouth 2 (two) times daily before a meal.   propranolol ER (INDERAL LA) 60 MG 24 hr capsule Take 1 capsule (60 mg total) by mouth daily.   triamcinolone cream (KENALOG) 0.1 % Apply 1 application topically 2 (two) times daily.   HYDROcodone-acetaminophen (NORCO/VICODIN) 5-325 MG tablet TAKE ONE TABLET BY MOUTH EVERY 4 TO 6 HOURS AS NEEDED FOR PAIN (Patient not taking: Reported on 06/10/2020)    No facility-administered encounter medications on file as of 10/18/2020.    Allergies (verified) Molds & smuts and Pollen extract   History: Past Medical History:  Diagnosis Date   History of chicken pox    Hyperlipidemia    LBBB (left bundle branch block)    Migraines    Schatzki's ring    SVT (supraventricular tachycardia) (HCC)    Tachycardia    Past Surgical History:  Procedure Laterality Date   HYSTERECTOMY ABDOMINAL WITH SALPINGECTOMY  03/30/1991   LUMBAR DISC SURGERY  03/19/1997   MASTECTOMY Left    Due to surgical complications related to previous breast implant   PLACEMENT OF BREAST IMPLANTS  03/19/2013   TONSILLECTOMY  03/20/1983   Family History  Problem Relation Age of Onset   Congenital heart disease Mother    Congestive Heart Failure Mother    Hyperlipidemia Mother    Hypertension Mother    Kidney disease Mother    Arthritis Mother    Hearing loss Mother    Cancer Father        Lung   Breast cancer Sister    Cancer Brother    Alcohol abuse Brother    Drug abuse Brother    Early death Brother    Heart attack Brother    Stroke Maternal Grandmother    Arthritis Maternal Grandmother    Hypercholesterolemia Maternal Grandmother    Hypertension Maternal Grandmother    Arthritis Paternal Grandmother    Heart attack Paternal Grandmother    Hypercholesterolemia Paternal Grandmother  Hypertension Paternal Grandmother    Alcohol abuse Brother    Early death Brother    Hypercholesterolemia Son    Hypertension Son    Social History   Socioeconomic History   Marital status: Married    Spouse name: Not on file   Number of children: Not on file   Years of education: Not on file   Highest education level: Not on file  Occupational History   Occupation: Retired  Tobacco Use   Smoking status: Never   Smokeless tobacco: Former  Scientific laboratory technician Use: Never used  Substance and Sexual Activity   Alcohol use: Yes    Comment: 7-10 drinks per week    Drug use: Never   Sexual activity: Not on file  Other Topics Concern   Not on file  Social History Narrative   Not on file   Social Determinants of Health   Financial Resource Strain: Low Risk    Difficulty of Paying Living Expenses: Not hard at all  Food Insecurity: No Food Insecurity   Worried About Charity fundraiser in the Last Year: Never true   Arboriculturist in the Last Year: Never true  Transportation Needs: No Transportation Needs   Lack of Transportation (Medical): No   Lack of Transportation (Non-Medical): No  Physical Activity: Sufficiently Active   Days of Exercise per Week: 5 days   Minutes of Exercise per Session: 40 min  Stress: No Stress Concern Present   Feeling of Stress : Not at all  Social Connections: Moderately Integrated   Frequency of Communication with Friends and Family: More than three times a week   Frequency of Social Gatherings with Friends and Family: More than three times a week   Attends Religious Services: More than 4 times per year   Active Member of [employer] or Organizations: No   Attends Music therapist: Never   Marital Status: Married    Tobacco Counseling Counseling given: Not Answered   Clinical Intake:  Pre-visit preparation completed: Yes  Pain : No/denies pain     Nutritional Risks: None Diabetes: No  How often do you need to have someone help you when you read instructions, pamphlets, or other written materials from your doctor or pharmacy?: 1 - Never  [place] Needed?: No  Information entered by :: Leroy Kennedy LPN   Activities of Daily Living In your present state of health, do you have any difficulty performing the following activities: 10/18/2020  Hearing? N  Vision? N  Difficulty concentrating or making decisions? N  Walking or climbing stairs? N  Dressing or bathing? N  Doing errands, shopping? N  Preparing Food and eating ? N  Using the Toilet? N  In the past six months,  have you accidently leaked urine? N  Do you have problems with loss of bowel control? N  Managing your Medications? N  Managing your Finances? N  Housekeeping or managing your Housekeeping? N  Some recent data might be hidden    Patient Care Team: Ronnald Nian, DO as PCP - General (Family Medicine)  Indicate any recent Medical Services you may have received from other than [facility] providers in the past year (date may be approximate).     Assessment:   This is a routine wellness examination for Vi.  Hearing/Vision screen Hearing Screening - Comments:: No trouble hearing Vision Screening - Comments:: Dr. Katy Fitch Up to date  Dietary issues and exercise activities discussed: Current Exercise  Habits: Structured exercise class, Type of exercise: yoga;walking;treadmill, Time (Minutes): 40, Frequency (Times/Week): 5, Weekly Exercise (Minutes/Week): 200, Intensity: Moderate   Goals Addressed             This Visit's Progress    Patient Stated       Maintain current lifestyle       Depression Screen PHQ 2/9 Scores 10/18/2020 09/09/2019 09/03/2018 05/28/2018 07/16/2017  PHQ - 2 Score 0 0 0 0 0    Fall Risk Fall Risk  10/18/2020 09/09/2019 08/05/2019 09/03/2018 05/28/2018  Falls in the past year? 0 0 0 0 0  Number falls in past yr: 0 0 - - -  Injury with Fall? 0 0 - - -  Risk for fall due to : - No Fall Risks - - -  Follow up Falls evaluation completed;Falls prevention discussed Falls prevention discussed - - -    FALL RISK PREVENTION PERTAINING TO THE HOME:  Any stairs in or around the home? Yes  If so, are there any without handrails? Yes  Home free of loose throw rugs in walkways, pet beds, electrical cords, etc? Yes  Adequate lighting in your home to reduce risk of falls? Yes   ASSISTIVE DEVICES UTILIZED TO PREVENT FALLS:  Life alert? No  Use of a cane, walker or w/c? No  Grab bars in the bathroom? No  Shower chair or bench in shower? Yes  Elevated toilet seat or a  handicapped toilet? No   TIMED UP AND GO:  Was the test performed? No .      Cognitive Function:  Normal cognitive status assessed by direct observation by this Nurse Health Advisor. No abnormalities found.       6CIT Screen 09/09/2019  What Year? 0 points  What month? 0 points  What time? 0 points  Count back from 20 0 points  Months in reverse 0 points  Repeat phrase 0 points  Total Score 0    Immunizations Immunization History  Administered Date(s) Administered   Fluad Quad(high Dose 65+) 12/24/2018   H1N1 01/22/2008   Influenza, High Dose Seasonal PF 02/16/2005, 02/21/2006, 01/09/2007, 11/27/2007, 01/22/2008, 11/15/2008, 11/09/2009, 12/30/2010, 12/22/2012, 12/23/2013, 01/07/2015, 12/19/2015, 12/25/2016, 12/05/2017   Influenza-Unspecified 12/17/2019   PFIZER(Purple Top)SARS-COV-2 Vaccination 05/12/2019, 06/02/2019, 01/25/2020, 08/09/2020   Pneumococcal Conjugate-13 04/27/2013   Pneumococcal Polysaccharide-23 06/25/2011   Tdap 11/09/2009, 07/18/2010   Zoster Recombinat (Shingrix) 01/30/2017   Zoster, Live 10/22/2007    TDAP status: Up to date  Flu Vaccine status: Up to date  Pneumococcal vaccine status: Up to date  Covid-19 vaccine status: Completed vaccines  Qualifies for Shingles Vaccine? No   Zostavax completed Yes   Shingrix Completed?: Yes  Screening Tests Health Maintenance  Topic Date Due   Zoster Vaccines- Shingrix (2 of 2) 03/27/2017   TETANUS/TDAP  07/17/2020   INFLUENZA VACCINE  10/17/2020   MAMMOGRAM  12/15/2021   COLONOSCOPY (Pts 45-43yr Insurance coverage will need to be confirmed)  03/24/2023   DEXA SCAN  Completed   COVID-19 Vaccine  Completed   Hepatitis C Screening  Completed   PNA vac Low Risk Adult  Completed   HPV VACCINES  Aged Out    Health Maintenance  Health Maintenance Due  Topic Date Due   Zoster Vaccines- Shingrix (2 of 2) 03/27/2017   TETANUS/TDAP  07/17/2020   INFLUENZA VACCINE  10/17/2020    Colonoscopy  will  discuss with Dr. RGena Fray Mammogram status: Completed  . Repeat every year  Bone Density status: Completed  .  Results reflect: Bone density results: NORMAL. Repeat every 5 years.  Lung Cancer Screening: (Low Dose CT Chest recommended if Age 58-80 years, 30 pack-year currently smoking OR have quit w/in 15years.) does not qualify.   Lung Cancer Screening Referral:   Additional Screening:  Hepatitis C Screening: does not qualify;   Vision Screening: Recommended annual ophthalmology exams for early detection of glaucoma and other disorders of the eye. Is the patient up to date with their annual eye exam?  Yes  Who is the provider or what is the name of the office in which the patient attends annual eye exams? Dr. Katy Fitch If pt is not established with a provider, would they like to be referred to a provider to establish care? No .   Dental Screening: Recommended annual dental exams for proper oral hygiene  Community Resource Referral / Chronic Care Management: CRR required this visit?  No   CCM required this visit?  No      Plan:     I have personally reviewed and noted the following in the patient's chart:   Medical and social history Use of alcohol, tobacco or illicit drugs  Current medications and supplements including opioid prescriptions.  Functional ability and status Nutritional status Physical activity Advanced directives List of other physicians Hospitalizations, surgeries, and ER visits in previous 12 months Vitals Screenings to include cognitive, depression, and falls Referrals and appointments  In addition, I have reviewed and discussed with patient certain preventive protocols, quality metrics, and best practice recommendations. A written personalized care plan for preventive services as well as general preventive health recommendations were provided to patient.     Leroy Kennedy, LPN   [id number]   Nurse Notes: na

## 2020-10-18 NOTE — Patient Instructions (Signed)
Kristin Hinton , Thank you for taking time to come for your Medicare Wellness Visit. I appreciate your ongoing commitment to your health goals. Please review the following plan we discussed and let me know if I can assist you in the future.   Screening recommendations/referrals: Colonoscopy: Education provided Mammogram: up to date Bone Density: up to date Recommended yearly ophthalmology/optometry visit for glaucoma screening and checkup Recommended yearly dental visit for hygiene and checkup  Vaccinations: Influenza vaccine: up to date Pneumococcal vaccine: up to date Tdap vaccine: up to date Shingles vaccine: up to date    Advanced directives: on file  Conditions/risks identified: na  Next appointment: 12-09-2020 @ 9:00 am Dr. Gena Fray   Preventive Care 65 Years and Older, Female Preventive care refers to lifestyle choices and visits with your health care provider that can promote health and wellness. What does preventive care include? A yearly physical exam. This is also called an annual well check. Dental exams once or twice a year. Routine eye exams. Ask your health care provider how often you should have your eyes checked. Personal lifestyle choices, including: Daily care of your teeth and gums. Regular physical activity. Eating a healthy diet. Avoiding tobacco and drug use. Limiting alcohol use. Practicing safe sex. Taking low-dose aspirin every day. Taking vitamin and mineral supplements as recommended by your health care provider. What happens during an annual well check? The services and screenings done by your health care provider during your annual well check will depend on your age, overall health, lifestyle risk factors, and family history of disease. Counseling  Your health care provider may ask you questions about your: Alcohol use. Tobacco use. Drug use. Emotional well-being. Home and relationship well-being. Sexual activity. Eating habits. History of  falls. Memory and ability to understand (cognition). Work and work Statistician. Reproductive health. Screening  You may have the following tests or measurements: Height, weight, and BMI. Blood pressure. Lipid and cholesterol levels. These may be checked every 5 years, or more frequently if you are over 52 years old. Skin check. Lung cancer screening. You may have this screening every year starting at age 2 if you have a 30-pack-year history of smoking and currently smoke or have quit within the past 15 years. Fecal occult blood test (FOBT) of the stool. You may have this test every year starting at age 45. Flexible sigmoidoscopy or colonoscopy. You may have a sigmoidoscopy every 5 years or a colonoscopy every 10 years starting at age 35. Hepatitis C blood test. Hepatitis B blood test. Sexually transmitted disease (STD) testing. Diabetes screening. This is done by checking your blood sugar (glucose) after you have not eaten for a while (fasting). You may have this done every 1-3 years. Bone density scan. This is done to screen for osteoporosis. You may have this done starting at age 20. Mammogram. This may be done every 1-2 years. Talk to your health care provider about how often you should have regular mammograms. Talk with your health care provider about your test results, treatment options, and if necessary, the need for more tests. Vaccines  Your health care provider may recommend certain vaccines, such as: Influenza vaccine. This is recommended every year. Tetanus, diphtheria, and acellular pertussis (Tdap, Td) vaccine. You may need a Td booster every 10 years. Zoster vaccine. You may need this after age 44. Pneumococcal 13-valent conjugate (PCV13) vaccine. One dose is recommended after age 50. Pneumococcal polysaccharide (PPSV23) vaccine. One dose is recommended after age 22. Talk to your health  care provider about which screenings and vaccines you need and how often you need  them. This information is not intended to replace advice given to you by your health care provider. Make sure you discuss any questions you have with your health care provider. Document Released: 04/01/2015 Document Revised: 11/23/2015 Document Reviewed: 01/04/2015 Elsevier Interactive Patient Education  2017 [employer] Prevention in the Home Falls can cause injuries. They can happen to people of all ages. There are many things you can do to make your home safe and to help prevent falls. What can I do on the outside of my home? Regularly fix the edges of walkways and driveways and fix any cracks. Remove anything that might make you trip as you walk through a door, such as a raised step or threshold. Trim any bushes or trees on the path to your home. Use bright outdoor lighting. Clear any walking paths of anything that might make someone trip, such as rocks or tools. Regularly check to see if handrails are loose or broken. Make sure that both sides of any steps have handrails. Any raised decks and porches should have guardrails on the edges. Have any leaves, snow, or ice cleared regularly. Use sand or salt on walking paths during winter. Clean up any spills in your garage right away. This includes oil or grease spills. What can I do in the bathroom? Use night lights. Install grab bars by the toilet and in the tub and shower. Do not use towel bars as grab bars. Use non-skid mats or decals in the tub or shower. If you need to sit down in the shower, use a plastic, non-slip stool. Keep the floor dry. Clean up any water that spills on the floor as soon as it happens. Remove soap buildup in the tub or shower regularly. Attach bath mats securely with double-sided non-slip rug tape. Do not have throw rugs and other things on the floor that can make you trip. What can I do in the bedroom? Use night lights. Make sure that you have a light by your bed that is easy to reach. Do not use  any sheets or blankets that are too big for your bed. They should not hang down onto the floor. Have a firm chair that has side arms. You can use this for support while you get dressed. Do not have throw rugs and other things on the floor that can make you trip. What can I do in the kitchen? Clean up any spills right away. Avoid walking on wet floors. Keep items that you use a lot in easy-to-reach places. If you need to reach something above you, use a strong step stool that has a grab bar. Keep electrical cords out of the way. Do not use floor polish or wax that makes floors slippery. If you must use wax, use non-skid floor wax. Do not have throw rugs and other things on the floor that can make you trip. What can I do with my stairs? Do not leave any items on the stairs. Make sure that there are handrails on both sides of the stairs and use them. Fix handrails that are broken or loose. Make sure that handrails are as long as the stairways. Check any carpeting to make sure that it is firmly attached to the stairs. Fix any carpet that is loose or worn. Avoid having throw rugs at the top or bottom of the stairs. If you do have throw rugs, attach them to  the floor with carpet tape. Make sure that you have a light switch at the top of the stairs and the bottom of the stairs. If you do not have them, ask someone to add them for you. What else can I do to help prevent falls? Wear shoes that: Do not have high heels. Have rubber bottoms. Are comfortable and fit you well. Are closed at the toe. Do not wear sandals. If you use a stepladder: Make sure that it is fully opened. Do not climb a closed stepladder. Make sure that both sides of the stepladder are locked into place. Ask someone to hold it for you, if possible. Clearly mark and make sure that you can see: Any grab bars or handrails. First and last steps. Where the edge of each step is. Use tools that help you move around (mobility aids)  if they are needed. These include: Canes. Walkers. Scooters. Crutches. Turn on the lights when you go into a dark area. Replace any light bulbs as soon as they burn out. Set up your furniture so you have a clear path. Avoid moving your furniture around. If any of your floors are uneven, fix them. If there are any pets around you, be aware of where they are. Review your medicines with your doctor. Some medicines can make you feel dizzy. This can increase your chance of falling. Ask your doctor what other things that you can do to help prevent falls. This information is not intended to replace advice given to you by your health care provider. Make sure you discuss any questions you have with your health care provider. Document Released: 12/30/2008 Document Revised: 08/11/2015 Document Reviewed: 04/09/2014 Elsevier Interactive Patient Education  2017 [employer].

## 2020-11-11 DIAGNOSIS — L718 Other rosacea: Secondary | ICD-10-CM | POA: Diagnosis not present

## 2020-11-11 DIAGNOSIS — L821 Other seborrheic keratosis: Secondary | ICD-10-CM | POA: Diagnosis not present

## 2020-11-11 DIAGNOSIS — D485 Neoplasm of uncertain behavior of skin: Secondary | ICD-10-CM | POA: Diagnosis not present

## 2020-11-11 DIAGNOSIS — L700 Acne vulgaris: Secondary | ICD-10-CM | POA: Diagnosis not present

## 2020-11-11 DIAGNOSIS — D225 Melanocytic nevi of trunk: Secondary | ICD-10-CM | POA: Diagnosis not present

## 2020-11-11 DIAGNOSIS — L820 Inflamed seborrheic keratosis: Secondary | ICD-10-CM | POA: Diagnosis not present

## 2020-11-11 DIAGNOSIS — L570 Actinic keratosis: Secondary | ICD-10-CM | POA: Diagnosis not present

## 2020-11-22 ENCOUNTER — Other Ambulatory Visit: Payer: Self-pay

## 2020-11-22 ENCOUNTER — Ambulatory Visit (INDEPENDENT_AMBULATORY_CARE_PROVIDER_SITE_OTHER): Payer: Medicare Other | Admitting: Family Medicine

## 2020-11-22 ENCOUNTER — Encounter: Payer: Self-pay | Admitting: Family Medicine

## 2020-11-22 DIAGNOSIS — M24159 Other articular cartilage disorders, unspecified hip: Secondary | ICD-10-CM | POA: Diagnosis not present

## 2020-11-22 NOTE — Progress Notes (Signed)
Kristin Hinton - 75 y.o. female MRN [id number]  Date of birth: [birth year]  SUBJECTIVE:  Including CC & ROS.  No chief complaint on file.   Kristin Hinton is a 75 y.o. female that is following up for her left hip pain.  She has done well with the home exercises and treatment.  Denies any significant pain.  She is able to do her normal activities.    Review of Systems See HPI   HISTORY: Past Medical, Surgical, Social, and Family History Reviewed & Updated per EMR.   Pertinent Historical Findings include:  Past Medical History:  Diagnosis Date   History of chicken pox    Hyperlipidemia    LBBB (left bundle branch block)    Migraines    Schatzki's ring    SVT (supraventricular tachycardia) (HCC)    Tachycardia     Past Surgical History:  Procedure Laterality Date   HYSTERECTOMY ABDOMINAL WITH SALPINGECTOMY  03/30/1991   LUMBAR DISC SURGERY  03/19/1997   MASTECTOMY Left    Due to surgical complications related to previous breast implant   PLACEMENT OF BREAST IMPLANTS  03/19/2013   TONSILLECTOMY  03/20/1983    Family History  Problem Relation Age of Onset   Congenital heart disease Mother    Congestive Heart Failure Mother    Hyperlipidemia Mother    Hypertension Mother    Kidney disease Mother    Arthritis Mother    Hearing loss Mother    Cancer Father        Lung   Breast cancer Sister    Cancer Brother    Alcohol abuse Brother    Drug abuse Brother    Early death Brother    Heart attack Brother    Stroke Maternal Grandmother    Arthritis Maternal Grandmother    Hypercholesterolemia Maternal Grandmother    Hypertension Maternal Grandmother    Arthritis Paternal Grandmother    Heart attack Paternal Grandmother    Hypercholesterolemia Paternal Grandmother    Hypertension Paternal Grandmother    Alcohol abuse Brother    Early death Brother    Hypercholesterolemia Son    Hypertension Son     Social History   Socioeconomic History   Marital status:  Married    Spouse name: Not on file   Number of children: Not on file   Years of education: Not on file   Highest education level: Not on file  Occupational History   Occupation: Retired  Tobacco Use   Smoking status: Never   Smokeless tobacco: Former  Scientific laboratory technician Use: Never used  Substance and Sexual Activity   Alcohol use: Yes    Comment: 7-10 drinks per week   Drug use: Never   Sexual activity: Not on file  Other Topics Concern   Not on file  Social History Narrative   Not on file   Social Determinants of Health   Financial Resource Strain: Low Risk    Difficulty of Paying Living Expenses: Not hard at all  Food Insecurity: No Food Insecurity   Worried About Charity fundraiser in the Last Year: Never true   Arboriculturist in the Last Year: Never true  Transportation Needs: No Transportation Needs   Lack of Transportation (Medical): No   Lack of Transportation (Non-Medical): No  Physical Activity: Sufficiently Active   Days of Exercise per Week: 5 days   Minutes of Exercise per Session: 40 min  Stress: No Stress Concern Present  Feeling of Stress : Not at all  Social Connections: Moderately Integrated   Frequency of Communication with Friends and Family: More than three times a week   Frequency of Social Gatherings with Friends and Family: More than three times a week   Attends Religious Services: More than 4 times per year   Active Member of [employer] or Organizations: No   Attends Music therapist: Never   Marital Status: Married  Human resources officer Violence: Not At Risk   Fear of Current or Ex-Partner: No   Emotionally Abused: No   Physically Abused: No   Sexually Abused: No     PHYSICAL EXAM:  VS: Ht 5' 6.5" (1.689 m)   Wt 115 lb (52.2 kg)   BMI 18.28 kg/m  Physical Exam Gen: NAD, alert, cooperative with exam, well-appearing      ASSESSMENT & PLAN:   Labral tear of hip, degenerative Has been doing well and has minimal pain  currently -Counseled on home exercise therapy and supportive care. -Could consider physical therapy or further imaging. -Provided Pennsaid samples.

## 2020-11-22 NOTE — Assessment & Plan Note (Signed)
Has been doing well and has minimal pain currently -Counseled on home exercise therapy and supportive care. -Could consider physical therapy or further imaging. -Provided Pennsaid samples.

## 2020-12-09 ENCOUNTER — Other Ambulatory Visit: Payer: Self-pay

## 2020-12-09 ENCOUNTER — Ambulatory Visit (INDEPENDENT_AMBULATORY_CARE_PROVIDER_SITE_OTHER): Payer: Medicare Other | Admitting: Family Medicine

## 2020-12-09 ENCOUNTER — Encounter: Payer: Self-pay | Admitting: Family Medicine

## 2020-12-09 VITALS — BP 126/72 | HR 56 | Temp 97.2°F | Ht 66.5 in | Wt 114.6 lb

## 2020-12-09 DIAGNOSIS — N951 Menopausal and female climacteric states: Secondary | ICD-10-CM | POA: Insufficient documentation

## 2020-12-09 DIAGNOSIS — J309 Allergic rhinitis, unspecified: Secondary | ICD-10-CM | POA: Insufficient documentation

## 2020-12-09 DIAGNOSIS — M19042 Primary osteoarthritis, left hand: Secondary | ICD-10-CM | POA: Diagnosis not present

## 2020-12-09 DIAGNOSIS — M19041 Primary osteoarthritis, right hand: Secondary | ICD-10-CM | POA: Diagnosis not present

## 2020-12-09 DIAGNOSIS — Z23 Encounter for immunization: Secondary | ICD-10-CM

## 2020-12-09 DIAGNOSIS — I471 Supraventricular tachycardia: Secondary | ICD-10-CM | POA: Diagnosis not present

## 2020-12-09 DIAGNOSIS — L719 Rosacea, unspecified: Secondary | ICD-10-CM | POA: Insufficient documentation

## 2020-12-09 DIAGNOSIS — L709 Acne, unspecified: Secondary | ICD-10-CM | POA: Insufficient documentation

## 2020-12-09 NOTE — Progress Notes (Signed)
[facility] [address] [place] [address] Dept: [phone] Dept Fax: [phone]  Transfer of Care Office Visit  Subjective:    Patient ID: Kristin Hinton, female    DOB: February 24, 1946, 75 y.o..   MRN: [id number]  Chief Complaint  Patient presents with   Transitions Of Care    Est care. Pt c/o arthritis hurting more in the am x1 mo.    History of Present Illness:  Patient is in today to establish care. Kristin Hinton is originally from [place], [place]. She moved to [place] in the 1980s with her 1st husband. She attended [employer], majoring in business. She worked in the Systems analyst. She was divorced and is now remarried for the past 17 years. She has 2 children from her first marriage (49, 41) and 5 grandchildren. She engages in regular exercise, with daily treadmill, yoga, and Pilates. She denies tobacco or drug use. She drinks about 1 alcoholic drink a day.  Kristin Hinton has a history of both acne and rosacea. She sees Dr. Pearline Cables with [facility]. She uses an azelaic acid-naicinamide cream and occasional Kenalog for managing this. She has a cream she uses for rosacea flares (?metronidazole).  Kristin Hinton has a history of SVT. She manages this with propranolol.  Kristin Hinton has had more recent issues with left hip pain that was due to a degenerative labral tear. She had been working with Dr,. Raeford Razor and notes this is resolved at this point.   Kristin Hinton has seasonal flares of allergic rhinitis. She uses a nasal steroid spray, but avoids other medications when she can.  Kristin Hinton notes some finger joint stiffness and pain. She notes these are occasionally swollen. She drinks cherry juice to help with mild to moderate joint pain and only occasionally uses ibuprofen or naproxen.  Past Medical History: Patient Active Problem List   Diagnosis Date Noted   Osteoarthritis of fingers of hands, bilateral  12/09/2020   Allergic rhinitis 12/09/2020   Symptomatic menopausal or female climacteric states 12/09/2020   Acne 12/09/2020   Rosacea 12/09/2020   Labral tear of hip, degenerative 08/25/2020   Greater trochanteric pain syndrome of left lower extremity 07/20/2020   Lumbar radiculopathy 06/22/2020   Insomnia 05/28/2018   SVT (supraventricular tachycardia) ([place]) 07/15/2016   LBBB (left bundle branch block) 07/15/2016   Schatzki's ring 07/15/2016   Past Surgical History:  Procedure Laterality Date   HYSTERECTOMY ABDOMINAL WITH SALPINGECTOMY  03/30/1991   LUMBAR DISC SURGERY  03/19/1997   MASTECTOMY Left    Due to surgical complications related to previous breast implant   PLACEMENT OF BREAST IMPLANTS  03/19/2013   TONSILLECTOMY  03/20/1983   Family History  Problem Relation Age of Onset   Congenital heart disease Mother    Congestive Heart Failure Mother    Hyperlipidemia Mother    Hypertension Mother    Kidney disease Mother    Arthritis Mother    Hearing loss Mother    Cancer Father        Lung   Breast cancer Sister    Cancer Brother        Lung   Alcohol abuse Brother    Drug abuse Brother    Early death Brother    Heart attack Brother    Alcohol abuse Brother    Early death Brother    Hypercholesterolemia Son    Hypertension Son    Heart attack Paternal Aunt    Stroke Maternal  Grandmother    Arthritis Maternal Grandmother    Hypercholesterolemia Maternal Grandmother    Hypertension Maternal Grandmother    Arthritis Paternal Grandmother    Heart attack Paternal Grandmother    Hypercholesterolemia Paternal Grandmother    Hypertension Paternal Grandmother    Outpatient Medications Prior to Visit  Medication Sig Dispense Refill   Azelaic Acid-Niacinamide 15-4 % CREA Apply     estradiol (VIVELLE-DOT) 0.075 MG/24HR APPLY 1 PATCH ONTO THE SKIN TWICE A WEEK 24 patch 3   fluticasone (FLONASE) 50 MCG/ACT nasal spray USE 2 SPRAYS IN EACH NOSTRIL DAILY 48 g 3    pantoprazole (PROTONIX) 40 MG tablet Take 1 tablet (40 mg total) by mouth 2 (two) times daily before a meal. 180 tablet 1   propranolol ER (INDERAL LA) 60 MG 24 hr capsule Take 1 capsule (60 mg total) by mouth daily. 90 capsule 3   triamcinolone cream (KENALOG) 0.1 % Apply 1 application topically 2 (two) times daily. 30 g 0   HYDROcodone-acetaminophen (NORCO/VICODIN) 5-325 MG tablet TAKE ONE TABLET BY MOUTH EVERY 4 TO 6 HOURS AS NEEDED FOR PAIN (Patient not taking: Reported on 06/10/2020)     meloxicam (MOBIC) 7.5 MG tablet Take 1 tablet (7.5 mg total) by mouth 2 (two) times daily as needed. 45 tablet 1   No facility-administered medications prior to visit.   Allergies  Allergen Reactions   Molds & Smuts Itching   Pollen Extract Itching    Objective:   Today's Vitals   12/09/20 0951  BP: 126/72  Pulse: (!) 56  Temp: (!) 97.2 F (36.2 C)  TempSrc: Temporal  SpO2: 99%  Weight: 114 lb 9.6 oz (52 kg)  Height: 5' 6.5" (1.689 m)   Body mass index is 18.22 kg/m.   General: Well developed, well nourished. No acute distress. Extremities: There is mild swelling of the PIP joints of a few fingers. No nodular changes noted.   Good ROM. Psych: Alert and oriented x3. Normal mood and affect.  Health Maintenance Due  Topic Date Due   Zoster Vaccines- Shingrix (2 of 2) 03/27/2017   TETANUS/TDAP  07/17/2020   INFLUENZA VACCINE  10/17/2020     Assessment & Plan:   1. Osteoarthritis of fingers of hands, bilateral MS. Kristin Hinton's exam is consistent with osteoarthritis. We discussed management iwht hot soaks with ROM and Tylenol as mainstay for pain management.  2. SVT (supraventricular tachycardia) (HCC) Stable on propranolol.  [name], MD

## 2020-12-19 DIAGNOSIS — Z1231 Encounter for screening mammogram for malignant neoplasm of breast: Secondary | ICD-10-CM | POA: Diagnosis not present

## 2020-12-19 LAB — HM MAMMOGRAPHY

## 2020-12-21 ENCOUNTER — Encounter: Payer: Self-pay | Admitting: Family Medicine

## 2020-12-23 ENCOUNTER — Encounter: Payer: Self-pay | Admitting: Family Medicine

## 2020-12-23 NOTE — Telephone Encounter (Signed)
Spoke to patient and she is feeling good this morning with lots of energy and no sypmtoms that she had yesterday.  Spoke to Provider and was advised to have her rest, drink plenty of fluids and stay cool today.  If any more problems she will notify us.  Patient agreeable to plan.   Dm/cma

## 2020-12-23 NOTE — Telephone Encounter (Signed)
Spoke to patient.  Dm/cma  

## 2021-01-24 ENCOUNTER — Telehealth: Payer: Self-pay | Admitting: Internal Medicine

## 2021-01-24 MED ORDER — PROPRANOLOL HCL ER 60 MG PO CP24: 60.0000 mg | ORAL_CAPSULE | Freq: Every day | ORAL | 3 refills | Status: DC

## 2021-01-24 NOTE — Telephone Encounter (Signed)
Chart reviewed.   Patient has an appointment scheduled.   Rx(s) sent to pharmacy electronically.  Patient notified and voiced understanding.

## 2021-01-24 NOTE — Telephone Encounter (Signed)
 *  STAT* If patient is at the pharmacy, call can be transferred to refill team.   1. Which medications need to be refilled? (please list name of each medication and dose if known)   propranolol ER (INDERAL LA) 60 MG 24 hr capsule  2. Which pharmacy/location (including street and city if local pharmacy) is medication to be sent to?  [facility] [place], [place]  3. Do they need a 30 day or 90 day supply? 90 days

## 2021-04-19 ENCOUNTER — Encounter: Payer: Self-pay | Admitting: Family Medicine

## 2021-04-19 DIAGNOSIS — K222 Esophageal obstruction: Secondary | ICD-10-CM

## 2021-04-19 MED ORDER — PANTOPRAZOLE SODIUM 40 MG PO TBEC: 40.0000 mg | DELAYED_RELEASE_TABLET | Freq: Two times a day (BID) | ORAL | 1 refills | Status: DC

## 2021-04-21 ENCOUNTER — Telehealth (INDEPENDENT_AMBULATORY_CARE_PROVIDER_SITE_OTHER): Payer: Medicare Other | Admitting: Family Medicine

## 2021-04-21 VITALS — Ht 66.5 in | Wt 112.0 lb

## 2021-04-21 DIAGNOSIS — U071 COVID-19: Secondary | ICD-10-CM | POA: Diagnosis not present

## 2021-04-21 NOTE — Progress Notes (Signed)
[facility] [address] [place] [address] Dept: [phone] Dept Fax: [phone]  Telephone Visit  I connected with Kristin Hinton on 04/21/21 at  8:30 AM EST by telephone and verified that I am speaking with the correct person using two identifiers.  Location patient: Home Location provider: Clinic Persons participating in the virtual visit: Patient, Provider  I discussed the limitations of evaluation and management by telemedicine and the availability of in person appointments. The patient expressed understanding and agreed to proceed.  Chief Complaint  Patient presents with   Acute Visit    C/o having positive for Covid x 1 week,   she coughing, fatigue, hoarseness in voice.  Has been taking Advil.     SUBJECTIVE:  HPI: Kristin Hinton is a 76 y.o. female who presents having ongoing symptoms related to a recent COVID infection. Ms. Plate notes that about 2 weeks ago, she awakened with sore throat and body aches. Over the next week, she tried to get good bed rest and tool Advil for the myalgias. By last Sat., she had developed a cough. Her husband, Richardson Landry, was sick during the same time. He had run fever, though Ms. Manetta had not. They both tested positive on a home Covid test on Sat. Since then, she has had some improvements, but has some lingering hoarseness, non-productive cough, and fatigue. She admits that she had some mild dyspnea earlier on, but this ha resolved. She repeated a COVID test yesterday and it was still positive. She notes the fatigue has been the hardest part to deal with, though she was able to get on her treadmill yesterday and felt good with this.  Past Medical History:  Diagnosis Date   History of chicken pox    Hyperlipidemia    LBBB (left bundle branch block)    Migraines    Schatzki's ring    SVT (supraventricular tachycardia) (HCC)    Tachycardia    Past Surgical History:  Procedure  Laterality Date   HYSTERECTOMY ABDOMINAL WITH SALPINGECTOMY  03/30/1991   LUMBAR DISC SURGERY  03/19/1997   MASTECTOMY Left    Due to surgical complications related to previous breast implant   PLACEMENT OF BREAST IMPLANTS  03/19/2013   TONSILLECTOMY  03/20/1983   Family History  Problem Relation Age of Onset   Congenital heart disease Mother    Congestive Heart Failure Mother    Hyperlipidemia Mother    Hypertension Mother    Kidney disease Mother    Arthritis Mother    Hearing loss Mother    Cancer Father        Lung   Breast cancer Sister    Cancer Brother        Lung   Alcohol abuse Brother    Drug abuse Brother    Early death Brother    Heart attack Brother    Alcohol abuse Brother    Early death Brother    Hypercholesterolemia Son    Hypertension Son    Heart attack Paternal Aunt    Stroke Maternal Grandmother    Arthritis Maternal Grandmother    Hypercholesterolemia Maternal Grandmother    Hypertension Maternal Grandmother    Arthritis Paternal Grandmother    Heart attack Paternal Grandmother    Hypercholesterolemia Paternal Grandmother    Hypertension Paternal Grandmother    Social History   Tobacco Use   Smoking status: Never   Smokeless tobacco: Former  Scientific laboratory technician Use: Never used  Substance Use Topics   Alcohol use: Yes    Comment: 7-10 drinks per week   Drug use: Never    Current Outpatient Medications:    Azelaic Acid-Niacinamide 15-4 % CREA, Apply, Disp: , Rfl:    estradiol (VIVELLE-DOT) 0.075 MG/24HR, APPLY 1 PATCH ONTO THE SKIN TWICE A WEEK, Disp: 24 patch, Rfl: 3   fluticasone (FLONASE) 50 MCG/ACT nasal spray, USE 2 SPRAYS IN EACH NOSTRIL DAILY, Disp: 48 g, Rfl: 3   Multiple Vitamin (MULTIVITAMIN) tablet, Take 1 tablet by mouth daily., Disp: , Rfl:    pantoprazole (PROTONIX) 40 MG tablet, Take 1 tablet (40 mg total) by mouth 2 (two) times daily before a meal., Disp: 180 tablet, Rfl: 1   propranolol ER (INDERAL LA) 60 MG 24 hr  capsule, Take 1 capsule (60 mg total) by mouth daily., Disp: 90 capsule, Rfl: 3   triamcinolone cream (KENALOG) 0.1 %, Apply 1 application topically 2 (two) times daily., Disp: 30 g, Rfl: 0  Allergies  Allergen Reactions   Molds & Smuts Itching   Pollen Extract Itching   ROS: See pertinent positives and negatives per HPI.  OBSERVATIONS/OBJECTIVE:  VITALS per patient if applicable: Vitals:   43/83/81 0820  Weight: 112 lb (50.8 kg)  Height: 5' 6.5" (1.689 m)     ASSESSMENT AND PLAN:  1. COVID-19 Reviewed home care instructions for COVID. Advised self-isolation at home for at least 5 days. After 5 days, if improved and fever resolved, can be in public, but should wear a mask around others for an additional 5 days. It seems she may be out of this 10 day window now. We discussed that fatigue is the most common symptom of long-COVID and will likely gradually improve over the next few weeks. If symptoms, esp, dyspnea develops/worsens, recommend in-person evaluation at either an urgent care or the emergency room.   I discussed the assessment and treatment plan with the patient. The patient was provided an opportunity to ask questions and all were answered. The patient agreed with the plan and demonstrated an understanding of the instructions.   The patient was advised to call back or seek an in-person evaluation if the symptoms worsen or if the condition fails to improve as anticipated.  I spent 20 minutes on this telephone encounter.  [name], MD

## 2021-05-01 ENCOUNTER — Ambulatory Visit: Payer: Medicare Other | Admitting: Student

## 2021-05-15 NOTE — Progress Notes (Signed)
? ? ?Office Visit  ?  ?Patient Name: Kristin Hinton ?Date of Encounter: 05/15/2021 ? ?Primary Care Provider:  [name], MD ?Primary Cardiologist:  None ? ?Chief Complaint  ?  ? ?1 year EP follow-up ? ? ?History of Present Illness  ?  ?Kristin Hinton 76 year old female with past medical history of LBBB, SVT, HLD, Schatzki's ring.  Echo performed at [facility] 5/17 with EF of 55% normal LV function, mild MR/TR.Cardiac cath 5/17 with normal coronaries.  She was diagnosed with SVT in 2017 and was seen by Dr. Caryl Comes in 7/19. Zio patch was ordered that showed short runs of increased sinus rate, PACs, PVCs.  She declined ablation at that time.  And she was given prescription for propranolol, nebivolol, and bisoprolol.  Patient has a history of intolerance on beta-blockers but tolerated well since being initiated.  She is currently on propranolol ER 60 mg.  She had a discussion with Dr. Caryl Comes in the past regarding possibility of trying ablation. ? ?Patient presents today for 1 year EP follow-up.  She reports that since her last appointment she has been doing well with no new cardiac complaints. She is tolerating her propranolol without any side effects. She reports that she rarely feels PVC's and that when she does they are not debilitating.She does yoga and walking daily for exercise without any PVC issues.She currently denies chest pain, shortness of breath, lower extremity edema, fatigue, palpitations,  presyncope, syncope, orthopnea, and PND.  ? ?Past Medical History  ?  ?Past Medical History:  ?Diagnosis Date  ? History of chicken pox   ? Hyperlipidemia   ? LBBB (left bundle branch block)   ? Migraines   ? Schatzki's ring   ? SVT (supraventricular tachycardia) ([place])   ? Tachycardia   ? ?Past Surgical History:  ?Procedure Laterality Date  ? HYSTERECTOMY ABDOMINAL WITH SALPINGECTOMY  03/30/1991  ? [place] SURGERY  03/19/1997  ? MASTECTOMY Left   ? Due to surgical complications related to previous breast  implant  ? PLACEMENT OF BREAST IMPLANTS  03/19/2013  ? TONSILLECTOMY  03/20/1983  ? ? ?Allergies ? ?Allergies  ?Allergen Reactions  ? Molds & Smuts Itching  ? Pollen Extract Itching  ? ? ? ? ?Home Medications  ?  ?Current Outpatient Medications  ?Medication Sig Dispense Refill  ? Azelaic Acid-Niacinamide 15-4 % CREA Apply    ? estradiol (VIVELLE-DOT) 0.075 MG/24HR APPLY 1 PATCH ONTO THE SKIN TWICE A WEEK 24 patch 3  ? fluticasone (FLONASE) 50 MCG/ACT nasal spray USE 2 SPRAYS IN EACH NOSTRIL DAILY 48 g 3  ? Multiple Vitamin (MULTIVITAMIN) tablet Take 1 tablet by mouth daily.    ? pantoprazole (PROTONIX) 40 MG tablet Take 1 tablet (40 mg total) by mouth 2 (two) times daily before a meal. 180 tablet 1  ? propranolol ER (INDERAL LA) 60 MG 24 hr capsule Take 1 capsule (60 mg total) by mouth daily. 90 capsule 3  ? triamcinolone cream (KENALOG) 0.1 % Apply 1 application topically 2 (two) times daily. 30 g 0  ? ?No current facility-administered medications for this visit.  ?  ? ?Review of Systems  ?Review of Systems  ?All other systems reviewed and are negative.  ? ?Physical Exam  ?  ?VS:   ?Vitals:  ? 05/17/21 1134  ?BP: 129/68  ?Pulse: (!) 59  ?SpO2: 99%  ? ? ? ?GEN: Well nourished, well developed, in no acute distress. ?HEENT: normal. ?Neck: Supple, no JVD, carotid bruits, or masses. ?Cardiac:  RRR, no murmurs, rubs, or gallops. No clubbing, cyanosis, edema.  Radials/DP/PT 2+ and equal bilaterally.  ?Respiratory:  Respirations regular and unlabored, clear to auscultation bilaterally. ?GI: Soft, nontender, nondistended, BS + x 4. ?MS: no deformity or atrophy. ?Skin: warm and dry, no rash. ?Neuro:  Strength and sensation are intact. ?Psych: Normal affect. ? ?Accessory Clinical Findings  ?  ?ECG personally reviewed by me today - Sinus Loletha Grayer with a rate of 57 bpm. - no acute changes. ? ?Lab Results  ?Component Value Date  ? WBC 5.0 05/28/2018  ? HGB 12.6 05/28/2018  ? HCT 37.2 05/28/2018  ? MCV 95.0 05/28/2018  ? PLT 286.0  05/28/2018  ? ?Lab Results  ?Component Value Date  ? CREATININE 0.89 05/28/2018  ? BUN 17 05/28/2018  ? NA 136 05/28/2018  ? K 4.6 05/28/2018  ? CL 101 05/28/2018  ? CO2 29 05/28/2018  ? ?Lab Results  ?Component Value Date  ? ALT 17 05/28/2018  ? AST 25 05/28/2018  ? ALKPHOS 51 05/28/2018  ? BILITOT 0.6 05/28/2018  ? ?Lab Results  ?Component Value Date  ? CHOL 205 (H) 05/28/2018  ? HDL 85.20 05/28/2018  ? LDLCALC 100 (H) 05/28/2018  ? TRIG 98.0 05/28/2018  ? CHOLHDL 2 05/28/2018  ?  ?No results found for: HGBA1C ? ?Assessment & Plan  ?  ?1.  SVT: ?- Continue propranolol ER 60 mg ?-EKG today was SB with out PVC's ? ?2. Heart Palpitations: ?-Denies sustained PVC's and currently tolerating medication regimen. ? ?Disposition: Follow-up with Dr. Caryl Comes or Tommye Standard, PA in 1 year ? ? ? ?Mable Fill, Marissa Nestle, NP ?05/15/2021, 3:09 PM ?    ?

## 2021-05-17 ENCOUNTER — Encounter: Payer: Self-pay | Admitting: Nurse Practitioner

## 2021-05-17 ENCOUNTER — Other Ambulatory Visit: Payer: Self-pay

## 2021-05-17 ENCOUNTER — Ambulatory Visit (INDEPENDENT_AMBULATORY_CARE_PROVIDER_SITE_OTHER): Payer: Medicare Other | Admitting: Nurse Practitioner

## 2021-05-17 VITALS — BP 129/68 | HR 59 | Ht 66.0 in | Wt 117.0 lb

## 2021-05-17 DIAGNOSIS — I471 Supraventricular tachycardia: Secondary | ICD-10-CM | POA: Diagnosis not present

## 2021-05-17 DIAGNOSIS — R002 Palpitations: Secondary | ICD-10-CM | POA: Diagnosis not present

## 2021-05-17 NOTE — Patient Instructions (Signed)
Medication Instructions:  Your physician recommends that you continue on your current medications as directed. Please refer to the Current Medication list given to you today.  *If you need a refill on your cardiac medications before your next appointment, please call your pharmacy*   Lab Work: None If you have labs (blood work) drawn today and your tests are completely normal, you will receive your results only by: . MyChart Message (if you have MyChart) OR . A paper copy in the mail If you have any lab test that is abnormal or we need to change your treatment, we will call you to review the results.   Follow-Up: At [facility], you and your health needs are our priority.  As part of our continuing mission to provide you with exceptional heart care, we have created designated Provider Care Teams.  These Care Teams include your primary Cardiologist (physician) and Advanced Practice Providers (APPs -  Physician Assistants and Nurse Practitioners) who all work together to provide you with the care you need, when you need it.  Your next appointment:   1 year(s)  The format for your next appointment:   In Person  Provider:   Steven Klein, MD   

## 2021-06-08 ENCOUNTER — Ambulatory Visit (INDEPENDENT_AMBULATORY_CARE_PROVIDER_SITE_OTHER): Payer: Medicare Other | Admitting: Family Medicine

## 2021-06-08 VITALS — BP 114/66 | HR 60 | Temp 97.0°F | Ht 66.0 in | Wt 115.8 lb

## 2021-06-08 DIAGNOSIS — I471 Supraventricular tachycardia: Secondary | ICD-10-CM | POA: Diagnosis not present

## 2021-06-08 DIAGNOSIS — J301 Allergic rhinitis due to pollen: Secondary | ICD-10-CM | POA: Diagnosis not present

## 2021-06-08 DIAGNOSIS — Z13220 Encounter for screening for lipoid disorders: Secondary | ICD-10-CM

## 2021-06-08 LAB — LIPID PANEL
Cholesterol: 233 mg/dL — ABNORMAL HIGH (ref 0–200)
HDL: 97.6 mg/dL (ref >39.00)
LDL Cholesterol: 120 mg/dL — ABNORMAL HIGH (ref 0–99)
NonHDL: 135.41
Total CHOL/HDL Ratio: 2
Triglycerides: 78 mg/dL (ref 0.0–149.0)
VLDL: 15.6 mg/dL (ref 0.0–40.0)

## 2021-06-08 NOTE — Progress Notes (Signed)
?[facility] ?[place] ?[place] [address] ?Dept: [phone] ?Dept Fax: [phone] ? ?Chronic Care Office Visit ? ?Subjective:  ? ? Patient ID: Kristin Hinton, female    DOB: 07-25-1945, 76 y.o..   MRN: [id number] ? ?Chief Complaint  ?Patient presents with  ? Follow-up  ?  6 month f/u. No concerns.    ? ? ?History of Present Illness: ? ?Patient is in today for reassessment of chronic medical issues. ? ?[name] has a history of SVT. She manages this with propranolol. she was recently seen by Dr. Olin Pia PA. Noted no PVCs. Plan to continue propranolol. ?  ?[name] has seasonal allergic rhinitis. She uses a nasal steroid spray, but avoids other medications when she can. She has some minor eye dryness and itchiness. ? ?Past Medical History: ?Patient Active Problem List  ? Diagnosis Date Noted  ? Osteoarthritis of fingers of hands, bilateral 12/09/2020  ? Allergic rhinitis 12/09/2020  ? Symptomatic menopausal or female climacteric states 12/09/2020  ? Acne 12/09/2020  ? Rosacea 12/09/2020  ? Labral tear of hip, degenerative 08/25/2020  ? Lumbar radiculopathy 06/22/2020  ? Insomnia 05/28/2018  ? SVT (supraventricular tachycardia) ([place]) 07/15/2016  ? LBBB (left bundle branch block) 07/15/2016  ? Schatzki's ring 07/15/2016  ? ?Past Surgical History:  ?Procedure Laterality Date  ? HYSTERECTOMY ABDOMINAL WITH SALPINGECTOMY  03/30/1991  ? [place] SURGERY  03/19/1997  ? MASTECTOMY Left   ? Due to surgical complications related to previous breast implant  ? PLACEMENT OF BREAST IMPLANTS  03/19/2013  ? TONSILLECTOMY  03/20/1983  ? ?Family History  ?Problem Relation Age of Onset  ? Congenital heart disease Mother   ? Congestive Heart Failure Mother   ? Hyperlipidemia Mother   ? Hypertension Mother   ? Kidney disease Mother   ? Arthritis Mother   ? Hearing loss Mother   ? Cancer Father   ?     Lung  ? Breast cancer Sister   ? Cancer Brother   ?     Lung  ?  Alcohol abuse Brother   ? Drug abuse Brother   ? Early death Brother   ? Heart attack Brother   ? Alcohol abuse Brother   ? Early death Brother   ? Hypercholesterolemia Son   ? Hypertension Son   ? Heart attack Paternal Aunt   ? Stroke Maternal Grandmother   ? Arthritis Maternal Grandmother   ? Hypercholesterolemia Maternal Grandmother   ? Hypertension Maternal Grandmother   ? Arthritis Paternal Grandmother   ? Heart attack Paternal Grandmother   ? Hypercholesterolemia Paternal Grandmother   ? Hypertension Paternal Grandmother   ? ?Outpatient Medications Prior to Visit  ?Medication Sig Dispense Refill  ? Azelaic Acid-Niacinamide 15-4 % CREA Apply    ? estradiol (VIVELLE-DOT) 0.075 MG/24HR APPLY 1 PATCH ONTO THE SKIN TWICE A WEEK 24 patch 3  ? fluticasone (FLONASE) 50 MCG/ACT nasal spray USE 2 SPRAYS IN EACH NOSTRIL DAILY 48 g 3  ? Multiple Vitamin (MULTIVITAMIN) tablet Take 1 tablet by mouth daily.    ? pantoprazole (PROTONIX) 40 MG tablet Take 1 tablet (40 mg total) by mouth 2 (two) times daily before a meal. 180 tablet 1  ? propranolol ER (INDERAL LA) 60 MG 24 hr capsule Take 1 capsule (60 mg total) by mouth daily. 90 capsule 3  ? triamcinolone cream (KENALOG) 0.1 % Apply 1 application topically 2 (two) times daily. 30 g 0  ? amoxicillin (AMOXIL)  500 MG capsule Take 500 mg by mouth 3 (three) times daily.    ? ?No facility-administered medications prior to visit.  ? ?Allergies  ?Allergen Reactions  ? Molds & Smuts Itching  ? Pollen Extract Itching  ? ?Objective:  ? ?Today's Vitals  ? 06/08/21 0853  ?BP: 114/66  ?Pulse: 60  ?Temp: (!) 97 ?F (36.1 ?C)  ?TempSrc: Temporal  ?SpO2: 99%  ?Weight: 115 lb 12.8 oz (52.5 kg)  ?Height: '5\' 6"'$  (1.676 m)  ? ?Body mass index is 18.69 kg/m?.  ? ?General: Well developed, well nourished. No acute distress. ?CV- Regular rate and rhythm. No murmurs or rubs. ?Psych: Alert and oriented. Normal mood and affect. ? ?Health Maintenance Due  ?Topic Date Due  ? Zoster Vaccines- Shingrix (2 of  2) 03/27/2017  ? TETANUS/TDAP  07/17/2020  ?   ?Assessment & Plan:  ? ?1. SVT (supraventricular tachycardia) ([place]) ?Stable. We will continue propranolol ER 60 mg daily. ? ?2. Screening for lipid disorders ?[name] asked about a scan for cardiac atherosclerosis. She has a past history of borderline lipids. She has no history of chest pain. We will reassess her lipids today. I did explain that coronary calcium scoring I sused when people are identified as having intermediate risk for heart disease and the prescribing of statins is in question. She would not have an indication for a cardiac CT angiogram. ? ?- Lipid panel ? ?Return in about 6 months (around 12/09/2021) for Reassessment.  ? ?[name], MD ?

## 2021-06-12 ENCOUNTER — Encounter: Payer: Self-pay | Admitting: Family Medicine

## 2021-06-12 DIAGNOSIS — I447 Left bundle-branch block, unspecified: Secondary | ICD-10-CM

## 2021-06-12 DIAGNOSIS — E782 Mixed hyperlipidemia: Secondary | ICD-10-CM

## 2021-06-13 DIAGNOSIS — E785 Hyperlipidemia, unspecified: Secondary | ICD-10-CM | POA: Insufficient documentation

## 2021-06-13 NOTE — Addendum Note (Signed)
Addended by: [name] on: 06/13/2021 06:44 PM ? ? Modules accepted: Orders ? ?

## 2021-07-12 ENCOUNTER — Ambulatory Visit
Admission: RE | Admit: 2021-07-12 | Discharge: 2021-07-12 | Disposition: A | Discharge status: Home / Self Care | Payer: Self-pay | Source: Ambulatory Visit | Attending: Family Medicine | Admitting: Family Medicine

## 2021-07-12 DIAGNOSIS — I447 Left bundle-branch block, unspecified: Secondary | ICD-10-CM

## 2021-07-12 DIAGNOSIS — E782 Mixed hyperlipidemia: Secondary | ICD-10-CM

## 2021-07-25 ENCOUNTER — Other Ambulatory Visit: Payer: Self-pay

## 2021-07-25 MED ORDER — ESTRADIOL 0.075 MG/24HR TD PTTW: MEDICATED_PATCH | TRANSDERMAL | 1 refills | Status: DC

## 2021-09-02 ENCOUNTER — Other Ambulatory Visit: Payer: Self-pay | Admitting: Family Medicine

## 2021-10-05 DIAGNOSIS — L650 Telogen effluvium: Secondary | ICD-10-CM | POA: Diagnosis not present

## 2021-10-17 ENCOUNTER — Ambulatory Visit (INDEPENDENT_AMBULATORY_CARE_PROVIDER_SITE_OTHER): Payer: Medicare Other | Admitting: Family Medicine

## 2021-10-17 ENCOUNTER — Encounter: Payer: Self-pay | Admitting: Family Medicine

## 2021-10-17 VITALS — BP 120/66 | HR 56 | Temp 97.6°F | Ht 66.0 in | Wt 116.6 lb

## 2021-10-17 DIAGNOSIS — M436 Torticollis: Secondary | ICD-10-CM | POA: Diagnosis not present

## 2021-10-17 MED ORDER — TIZANIDINE HCL 4 MG PO TABS: 4.0000 mg | ORAL_TABLET | Freq: Four times a day (QID) | ORAL | 0 refills | Status: DC | PRN

## 2021-10-17 NOTE — Progress Notes (Signed)
[facility] [address] [place] [address] Dept: [phone] Dept Fax: [phone]  Office Visit  Subjective:    Patient ID: Kristin Hinton, female    DOB: 06-Apr-1945, 76 y.o..   MRN: [id number]  Chief Complaint  Patient presents with   Acute Visit    C/o having neck pain off/on x 2 months.   She had used muscle relaxer and stretches with little relief.     History of Present Illness:  Patient is in today complaining of a 2 month history of intermittent left neck pain. As near as she recalls, she awakened with this one day. The pain does not radiate down the arm or into the shoulder. She does note some intermittent left ear discomfort and wondered if she might have a left sinus infection. She had a similar issue transiently in 2022 and was treated with a muscle relaxer. She has currently been trying to manage this condition with stretches, yoga, Aleve, and changing out her pillow. She did have some leftover muscle relaxer (Zanaflex) and does find this helps her sleep better.  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   Lumbar radiculopathy 06/22/2020   Insomnia 05/28/2018   SVT (supraventricular tachycardia) ([place]) 07/15/2016   LBBB (left bundle branch block) 07/15/2016   Schatzki's ring 07/15/2016   Past Surgical History:  Procedure Laterality Date   HYSTERECTOMY ABDOMINAL WITH SALPINGECTOMY  03/30/1991   LUMBAR DISC SURGERY  03/19/1997   MASTECTOMY Left    Due to surgical complications related to previous breast implant   PLACEMENT OF BREAST IMPLANTS  03/19/2013   TONSILLECTOMY  03/20/1983   Family History  Problem  Relation Age of Onset   Congenital heart disease Mother    Congestive Heart Failure Mother    Hyperlipidemia Mother    Hypertension Mother    Kidney disease Mother    Arthritis Mother    Hearing loss Mother    Cancer Father        Lung   Breast cancer Sister    Cancer Brother        Lung   Alcohol abuse Brother    Drug abuse Brother    Early death Brother    Heart attack Brother    Alcohol abuse Brother    Early death Brother    Hypercholesterolemia Son    Hypertension Son    Heart attack Paternal Aunt    Stroke Maternal Grandmother    Arthritis Maternal Grandmother    Hypercholesterolemia Maternal Grandmother    Hypertension Maternal Grandmother    Arthritis Paternal Grandmother    Heart attack Paternal Grandmother    Hypercholesterolemia Paternal Grandmother    Hypertension Paternal Grandmother    Outpatient Medications Prior to Visit  Medication Sig Dispense Refill   Azelaic Acid-Niacinamide 15-4 % CREA Apply     estradiol (VIVELLE-DOT) 0.075 MG/24HR APPLY 1 PATCH ONTO THE SKIN TWICE A WEEK 24 patch 1   fluticasone (FLONASE) 50 MCG/ACT nasal spray USE 2 SPRAYS IN EACH NOSTRIL DAILY 48 g 3   Multiple Vitamin (MULTIVITAMIN) tablet Take 1 tablet by mouth daily.     pantoprazole (PROTONIX) 40 MG tablet Take 1 tablet (40 mg total) by mouth  2 (two) times daily before a meal. 180 tablet 1   propranolol ER (INDERAL LA) 60 MG 24 hr capsule Take 1 capsule (60 mg total) by mouth daily. 90 capsule 3   triamcinolone cream (KENALOG) 0.1 % Apply 1 application topically 2 (two) times daily. 30 g 0   No facility-administered medications prior to visit.   Allergies  Allergen Reactions   Molds & Smuts Itching   Pollen Extract Itching     Objective:   Today's Vitals   10/17/21 1015  BP: 120/66  Pulse: (!) 56  Temp: 97.6 F (36.4 C)  TempSrc: Temporal  SpO2: 99%  Weight: 116 lb 9.6 oz (52.9 kg)  Height: '5\' 6"'$  (1.676 m)   Body mass index is 18.82 kg/m.   General: Well  developed, well nourished. No acute distress. HEENT: Normocephalic, non-traumatic. Left external ear, EAC and TM is normal. Neck: Supple. Tenderness noted over the inferior left mastoid down onto the scalene muscles. ROM to turnign to   the left is limited by pain, Psych: Alert and oriented. Normal mood and affect.  Health Maintenance Due  Topic Date Due   INFLUENZA VACCINE  10/17/2021     Assessment & Plan:   1. Wry neck This appears to be a strained scalene muscle. We discussed her ongoing efforts at heat, stretches, and use of Aleve. I recommend she consider a therapeutic massage to see if this will improve her resolution. I will renew the Zanaflex, since this has been helpful.  - tiZANidine (ZANAFLEX) 4 MG tablet; Take 1 tablet (4 mg total) by mouth every 6 (six) hours as needed for muscle spasms.  Dispense: 30 tablet; Refill: 0   Return if symptoms worsen or fail to improve.   [name], MD

## 2021-10-25 ENCOUNTER — Ambulatory Visit (INDEPENDENT_AMBULATORY_CARE_PROVIDER_SITE_OTHER): Payer: Medicare Other

## 2021-10-25 DIAGNOSIS — Z0000 Encounter for general adult medical examination without abnormal findings: Secondary | ICD-10-CM | POA: Diagnosis not present

## 2021-10-25 NOTE — Patient Instructions (Signed)
Kristin Hinton , Thank you for taking time to come for your Medicare Wellness Visit. I appreciate your ongoing commitment to your health goals. Please review the following plan we discussed and let me know if I can assist you in the future.   Screening recommendations/referrals: Colonoscopy: no longer required  Mammogram: no longer required  Bone Density: 12/16/2019 Recommended yearly ophthalmology/optometry visit for glaucoma screening and checkup Recommended yearly dental visit for hygiene and checkup  Vaccinations: Influenza vaccine: completed  Pneumococcal vaccine: completed  Tdap vaccine: 01/17/2021 Shingles vaccine: completed     Advanced directives: yes   Conditions/risks identified: none   Next appointment: none    Preventive Care 85 Years and Older, Female Preventive care refers to lifestyle choices and visits with your health care provider that can promote health and wellness. What does preventive care include? A yearly physical exam. This is also called an annual well check. Dental exams once or twice a year. Routine eye exams. Ask your health care provider how often you should have your eyes checked. Personal lifestyle choices, including: Daily care of your teeth and gums. Regular physical activity. Eating a healthy diet. Avoiding tobacco and drug use. Limiting alcohol use. Practicing safe sex. Taking low-dose aspirin every day. Taking vitamin and mineral supplements as recommended by your health care provider. What happens during an annual well check? The services and screenings done by your health care provider during your annual well check will depend on your age, overall health, lifestyle risk factors, and family history of disease. Counseling  Your health care provider may ask you questions about your: Alcohol use. Tobacco use. Drug use. Emotional well-being. Home and relationship well-being. Sexual activity. Eating habits. History of falls. Memory and  ability to understand (cognition). Work and work Statistician. Reproductive health. Screening  You may have the following tests or measurements: Height, weight, and BMI. Blood pressure. Lipid and cholesterol levels. These may be checked every 5 years, or more frequently if you are over 76 years old. Skin check. Lung cancer screening. You may have this screening every year starting at age 32 if you have a 30-pack-year history of smoking and currently smoke or have quit within the past 15 years. Fecal occult blood test (FOBT) of the stool. You may have this test every year starting at age 37. Flexible sigmoidoscopy or colonoscopy. You may have a sigmoidoscopy every 5 years or a colonoscopy every 10 years starting at age 57. Hepatitis C blood test. Hepatitis B blood test. Sexually transmitted disease (STD) testing. Diabetes screening. This is done by checking your blood sugar (glucose) after you have not eaten for a while (fasting). You may have this done every 1-3 years. Bone density scan. This is done to screen for osteoporosis. You may have this done starting at age 51. Mammogram. This may be done every 1-2 years. Talk to your health care provider about how often you should have regular mammograms. Talk with your health care provider about your test results, treatment options, and if necessary, the need for more tests. Vaccines  Your health care provider may recommend certain vaccines, such as: Influenza vaccine. This is recommended every year. Tetanus, diphtheria, and acellular pertussis (Tdap, Td) vaccine. You may need a Td booster every 10 years. Zoster vaccine. You may need this after age 28. Pneumococcal 13-valent conjugate (PCV13) vaccine. One dose is recommended after age 4. Pneumococcal polysaccharide (PPSV23) vaccine. One dose is recommended after age 64. Talk to your health care provider about which screenings and vaccines  you need and how often you need them. This information is  not intended to replace advice given to you by your health care provider. Make sure you discuss any questions you have with your health care provider. Document Released: 04/01/2015 Document Revised: 11/23/2015 Document Reviewed: 01/04/2015 Elsevier Interactive Patient Education  2017 [employer] Prevention in the Home Falls can cause injuries. They can happen to people of all ages. There are many things you can do to make your home safe and to help prevent falls. What can I do on the outside of my home? Regularly fix the edges of walkways and driveways and fix any cracks. Remove anything that might make you trip as you walk through a door, such as a raised step or threshold. Trim any bushes or trees on the path to your home. Use bright outdoor lighting. Clear any walking paths of anything that might make someone trip, such as rocks or tools. Regularly check to see if handrails are loose or broken. Make sure that both sides of any steps have handrails. Any raised decks and porches should have guardrails on the edges. Have any leaves, snow, or ice cleared regularly. Use sand or salt on walking paths during winter. Clean up any spills in your garage right away. This includes oil or grease spills. What can I do in the bathroom? Use night lights. Install grab bars by the toilet and in the tub and shower. Do not use towel bars as grab bars. Use non-skid mats or decals in the tub or shower. If you need to sit down in the shower, use a plastic, non-slip stool. Keep the floor dry. Clean up any water that spills on the floor as soon as it happens. Remove soap buildup in the tub or shower regularly. Attach bath mats securely with double-sided non-slip rug tape. Do not have throw rugs and other things on the floor that can make you trip. What can I do in the bedroom? Use night lights. Make sure that you have a light by your bed that is easy to reach. Do not use any sheets or blankets that  are too big for your bed. They should not hang down onto the floor. Have a firm chair that has side arms. You can use this for support while you get dressed. Do not have throw rugs and other things on the floor that can make you trip. What can I do in the kitchen? Clean up any spills right away. Avoid walking on wet floors. Keep items that you use a lot in easy-to-reach places. If you need to reach something above you, use a strong step stool that has a grab bar. Keep electrical cords out of the way. Do not use floor polish or wax that makes floors slippery. If you must use wax, use non-skid floor wax. Do not have throw rugs and other things on the floor that can make you trip. What can I do with my stairs? Do not leave any items on the stairs. Make sure that there are handrails on both sides of the stairs and use them. Fix handrails that are broken or loose. Make sure that handrails are as long as the stairways. Check any carpeting to make sure that it is firmly attached to the stairs. Fix any carpet that is loose or worn. Avoid having throw rugs at the top or bottom of the stairs. If you do have throw rugs, attach them to the floor with carpet tape. Make sure  that you have a light switch at the top of the stairs and the bottom of the stairs. If you do not have them, ask someone to add them for you. What else can I do to help prevent falls? Wear shoes that: Do not have high heels. Have rubber bottoms. Are comfortable and fit you well. Are closed at the toe. Do not wear sandals. If you use a stepladder: Make sure that it is fully opened. Do not climb a closed stepladder. Make sure that both sides of the stepladder are locked into place. Ask someone to hold it for you, if possible. Clearly mark and make sure that you can see: Any grab bars or handrails. First and last steps. Where the edge of each step is. Use tools that help you move around (mobility aids) if they are needed. These  include: Canes. Walkers. Scooters. Crutches. Turn on the lights when you go into a dark area. Replace any light bulbs as soon as they burn out. Set up your furniture so you have a clear path. Avoid moving your furniture around. If any of your floors are uneven, fix them. If there are any pets around you, be aware of where they are. Review your medicines with your doctor. Some medicines can make you feel dizzy. This can increase your chance of falling. Ask your doctor what other things that you can do to help prevent falls. This information is not intended to replace advice given to you by your health care provider. Make sure you discuss any questions you have with your health care provider. Document Released: 12/30/2008 Document Revised: 08/11/2015 Document Reviewed: 04/09/2014 Elsevier Interactive Patient Education  2017 [employer].

## 2021-10-25 NOTE — Progress Notes (Signed)
Subjective:   Kristin Hinton is a 76 y.o. female who presents for Medicare Annual (Subsequent) preventive examination.    I connected with Kristin Hinton  today by telephone and verified that I am speaking with the correct person using two identifiers. Location patient: home Location provider: work Persons participating in the virtual visit: patient, provider.   I discussed the limitations, risks, security and privacy concerns of performing an evaluation and management service by telephone and the availability of in person appointments. I also discussed with the patient that there may be a patient responsible charge related to this service. The patient expressed understanding and verbally consented to this telephonic visit.    Interactive audio and video telecommunications were attempted between this provider and patient, however failed, due to patient having technical difficulties OR patient did not have access to video capability.  We continued and completed visit with audio only.    Review of Systems     Cardiac Risk Factors include: advanced age (>25mn, >>69women)     Objective:    Today's Vitals   There is no height or weight on file to calculate BMI.     10/25/2021    9:42 AM 10/18/2020   11:29 AM 09/09/2019   11:20 AM 09/03/2018   10:05 AM  Advanced Directives  Does Patient Have a Medical Advance Directive? Yes Yes Yes Yes  Type of [place]amed[place]of [place] will Living will  Does patient want to make changes to medical advance directive?    No - Patient declined  Copy of [place] Chart? No - copy requested Yes - validated most recent copy scanned in chart (See row information) No - copy requested     Current Medications (verified) Outpatient Encounter Medications as of 10/25/2021  Medication Sig   Azelaic Acid-Niacinamide 15-4 % CREA Apply    estradiol (VIVELLE-DOT) 0.075 MG/24HR APPLY 1 PATCH ONTO THE SKIN TWICE A WEEK   fluticasone (FLONASE) 50 MCG/ACT nasal spray USE 2 SPRAYS IN EACH NOSTRIL DAILY   Multiple Vitamin (MULTIVITAMIN) tablet Take 1 tablet by mouth daily.   pantoprazole (PROTONIX) 40 MG tablet Take 1 tablet (40 mg total) by mouth 2 (two) times daily before a meal.   propranolol ER (INDERAL LA) 60 MG 24 hr capsule Take 1 capsule (60 mg total) by mouth daily.   tiZANidine (ZANAFLEX) 4 MG tablet Take 1 tablet (4 mg total) by mouth every 6 (six) hours as needed for muscle spasms.   triamcinolone cream (KENALOG) 0.1 % Apply 1 application topically 2 (two) times daily.   No facility-administered encounter medications on file as of 10/25/2021.    Allergies (verified) Molds & smuts and Pollen extract   History: Past Medical History:  Diagnosis Date   History of chicken pox    Hyperlipidemia    LBBB (left bundle branch block)    Migraines    Schatzki's ring    SVT (supraventricular tachycardia) (HCC)    Tachycardia    Past Surgical History:  Procedure Laterality Date   HYSTERECTOMY ABDOMINAL WITH SALPINGECTOMY  03/30/1991   LUMBAR DISC SURGERY  03/19/1997   MASTECTOMY Left    Due to surgical complications related to previous breast implant   PLACEMENT OF BREAST IMPLANTS  03/19/2013   TONSILLECTOMY  03/20/1983   Family History  Problem Relation Age of Onset   Congenital heart disease Mother    Congestive Heart Failure Mother  Hyperlipidemia Mother    Hypertension Mother    Kidney disease Mother    Arthritis Mother    Hearing loss Mother    Cancer Father        Lung   Breast cancer Sister    Cancer Brother        Lung   Alcohol abuse Brother    Drug abuse Brother    Early death Brother    Heart attack Brother    Alcohol abuse Brother    Early death Brother    Hypercholesterolemia Son    Hypertension Son    Heart attack Paternal Aunt    Stroke Maternal Grandmother    Arthritis Maternal  Grandmother    Hypercholesterolemia Maternal Grandmother    Hypertension Maternal Grandmother    Arthritis Paternal Grandmother    Heart attack Paternal Grandmother    Hypercholesterolemia Paternal Grandmother    Hypertension Paternal Grandmother    Social History   Socioeconomic History   Marital status: Married    Spouse name: Not on file   Number of children: 2   Years of education: Not on file   Highest education level: Bachelor's degree (e.g., BA, AB, BS)  Occupational History   Occupation: Retired  Tobacco Use   Smoking status: Never   Smokeless tobacco: Never  Vaping Use   Vaping Use: Never used  Substance and Sexual Activity   Alcohol use: Yes    Comment: 7-10 drinks per week   Drug use: Never   Sexual activity: Yes  Other Topics Concern   Not on file  Social History Narrative   Not on file   Social Determinants of Health   Financial Resource Strain: Low Risk  (10/25/2021)   Overall Financial Resource Strain (CARDIA)    Difficulty of Paying Living Expenses: Not hard at all  Food Insecurity: No Food Insecurity (10/25/2021)   Hunger Vital Sign    Worried About Running Out of Food in the Last Year: Never true    Ran Out of Food in the Last Year: Never true  Transportation Needs: No Transportation Needs (10/25/2021)   PRAPARE - Hydrologist (Medical): No    Lack of Transportation (Non-Medical): No  Physical Activity: Sufficiently Active (10/25/2021)   Exercise Vital Sign    Days of Exercise per Week: 5 days    Minutes of Exercise per Session: 30 min  Stress: No Stress Concern Present (10/25/2021)   [place]    Feeling of Stress : Not at all  Social Connections: Moderately Isolated (10/25/2021)   Social Connection and Isolation Panel [NHANES]    Frequency of Communication with Friends and Family: Three times a week    Frequency of Social Gatherings with Friends and Family:  Three times a week    Attends Religious Services: Never    Active Member of Clubs or Organizations: No    Attends Music therapist: Never    Marital Status: Married    Tobacco Counseling Counseling given: Not Answered   Clinical Intake:  Pre-visit preparation completed: Yes  Pain : No/denies pain     Nutritional Risks: None Diabetes: No  How often do you need to have someone help you when you read instructions, pamphlets, or other written materials from your doctor or pharmacy?: 1 - Never What is the last grade level you completed in school?: college  Diabetic?no   Interpreter Needed?: No  Information entered by :: L.Wilson,LPN  Activities of Daily Living    10/25/2021    9:42 AM  In your present state of health, do you have any difficulty performing the following activities:  Hearing? 0  Vision? 0  Difficulty concentrating or making decisions? 0  Walking or climbing stairs? 0  Dressing or bathing? 0  Doing errands, shopping? 0  Preparing Food and eating ? N  Using the Toilet? N  In the past six months, have you accidently leaked urine? N  Do you have problems with loss of bowel control? N  Managing your Medications? N  Managing your Finances? N  Housekeeping or managing your Housekeeping? N    Patient Care Team: [name], MD as PCP - General (Family Medicine) Rosemarie Ax, MD as Consulting Physician (Family Medicine) Macario Carls, MD as Referring Physician (Dermatology) Warden Fillers, MD as Consulting Physician (Ophthalmology) Deboraha Sprang, MD as Consulting Physician (Cardiology)  Indicate any recent Medical Services you may have received from other than [facility] providers in the past year (date may be approximate).     Assessment:   This is a routine wellness examination for Kristin Hinton.  Hearing/Vision screen Vision Screening - Comments:: Annual eye exams wear glasses   Dietary issues and exercise activities  discussed: Current Exercise Habits: Home exercise routine, Type of exercise: strength training/weights, Time (Minutes): 30, Frequency (Times/Week): 3, Weekly Exercise (Minutes/Week): 90, Intensity: Mild, Exercise limited by: None identified   Goals Addressed   None    Depression Screen    10/25/2021    9:42 AM 10/25/2021    9:40 AM 06/08/2021    8:53 AM 10/18/2020   11:34 AM 09/09/2019   11:24 AM 09/03/2018   10:09 AM 05/28/2018    9:19 AM  PHQ 2/9 Scores  PHQ - 2 Score 0 0 0 0 0 0 0    Fall Risk    10/25/2021    9:42 AM 06/08/2021    8:53 AM 10/18/2020   11:28 AM 09/09/2019   11:22 AM 08/05/2019    1:22 PM  Fall Risk   Falls in the past year? 0 0 0 0 0  Number falls in past yr: 0 0 0 0   Injury with Fall? 0 0 0 0   Risk for fall due to :  No Fall Risks  No Fall Risks   Follow up Falls evaluation completed;Education provided Falls evaluation completed Falls evaluation completed;Falls prevention discussed Falls prevention discussed     FALL RISK PREVENTION PERTAINING TO THE HOME:  Any stairs in or around the home? Yes  If so, are there any without handrails? No  Home free of loose throw rugs in walkways, pet beds, electrical cords, etc? Yes  Adequate lighting in your home to reduce risk of falls? Yes   ASSISTIVE DEVICES UTILIZED TO PREVENT FALLS:  Life alert? No  Use of a cane, walker or w/c? No  Grab bars in the bathroom? No  Shower chair or bench in shower? No  Elevated toilet seat or a handicapped toilet? No     Cognitive Function:  Normal cognitive status assessed by telephone conversation  by this Nurse Health Advisor. No abnormalities found.        09/09/2019   11:26 AM  6CIT Screen  What Year? 0 points  What month? 0 points  What time? 0 points  Count back from 20 0 points  Months in reverse 0 points  Repeat phrase 0 points  Total Score 0 points  Immunizations Immunization History  Administered Date(s) Administered   Fluad Quad(high Dose 65+) 12/24/2018,  12/09/2020   H1N1 01/22/2008   Influenza, High Dose Seasonal PF 02/16/2005, 02/21/2006, 01/09/2007, 11/27/2007, 01/22/2008, 11/15/2008, 11/09/2009, 12/30/2010, 12/22/2012, 12/23/2013, 01/07/2015, 12/19/2015, 12/25/2016, 12/05/2017   Influenza-Unspecified 12/17/2019   PFIZER(Purple Top)SARS-COV-2 Vaccination 05/12/2019, 06/02/2019, 01/25/2020, 08/09/2020   Pfizer Covid-19 Vaccine Bivalent Booster 22yr & up 01/26/2021   Pneumococcal Conjugate-13 04/27/2013   Pneumococcal Polysaccharide-23 06/25/2011   Tdap 11/09/2009, 01/17/2021   Zoster Recombinat (Shingrix) 10/29/2016, 01/30/2017   Zoster, Live 10/22/2007    TDAP status: Up to date  Flu Vaccine status: Up to date  Pneumococcal vaccine status: Up to date  Covid-19 vaccine status: Completed vaccines  Qualifies for Shingles Vaccine? Yes   Zostavax completed Yes   Shingrix Completed?: Yes  Screening Tests Health Maintenance  Topic Date Due   INFLUENZA VACCINE  10/17/2021   COVID-19 Vaccine (6 - Pfizer series) 11/02/2021 (Originally 05/26/2021)   COLONOSCOPY (Pts 45-466yrInsurance coverage will need to be confirmed)  03/24/2023   TETANUS/TDAP  01/18/2031   Pneumonia Vaccine 65103Years old  Completed   DEXA SCAN  Completed   Hepatitis C Screening  Completed   Zoster Vaccines- Shingrix  Completed   HPV VACCINES  Aged Out    Health Maintenance  Health Maintenance Due  Topic Date Due   INFLUENZA VACCINE  10/17/2021    Colorectal cancer screening: No longer required.   Mammogram status: No longer required due to age.  Bone Density status: Completed 12/16/2019. Results reflect: Bone density results: OSTEOPENIA. Repeat every 5 years.  Lung Cancer Screening: (Low Dose CT Chest recommended if Age 76-80ears, 30 pack-year currently smoking OR have quit w/in 15years.) does not qualify.   Lung Cancer Screening Referral: n/a  Additional Screening:  Hepatitis C Screening: does not qualify;   Vision Screening: Recommended  annual ophthalmology exams for early detection of glaucoma and other disorders of the eye. Is the patient up to date with their annual eye exam?  Yes  Who is the provider or what is the name of the office in which the patient attends annual eye exams? Dr.Groat  If pt is not established with a provider, would they like to be referred to a provider to establish care? No .   Dental Screening: Recommended annual dental exams for proper oral hygiene  Community Resource Referral / Chronic Care Management: CRR required this visit?  No   CCM required this visit?  No      Plan:     I have personally reviewed and noted the following in the patient's chart:   Medical and social history Use of alcohol, tobacco or illicit drugs  Current medications and supplements including opioid prescriptions.  Functional ability and status Nutritional status Physical activity Advanced directives List of other physicians Hospitalizations, surgeries, and ER visits in previous 12 months Vitals Screenings to include cognitive, depression, and falls Referrals and appointments  In addition, I have reviewed and discussed with patient certain preventive protocols, quality metrics, and best practice recommendations. A written personalized care plan for preventive services as well as general preventive health recommendations were provided to patient.     LaDaphane ShepherdLPN   8/06/25/7024 Nurse Notes: none

## 2021-11-17 DIAGNOSIS — H2513 Age-related nuclear cataract, bilateral: Secondary | ICD-10-CM | POA: Diagnosis not present

## 2021-11-17 DIAGNOSIS — H43811 Vitreous degeneration, right eye: Secondary | ICD-10-CM | POA: Diagnosis not present

## 2021-11-28 DIAGNOSIS — D225 Melanocytic nevi of trunk: Secondary | ICD-10-CM | POA: Diagnosis not present

## 2021-11-28 DIAGNOSIS — L821 Other seborrheic keratosis: Secondary | ICD-10-CM | POA: Diagnosis not present

## 2021-11-28 DIAGNOSIS — L814 Other melanin hyperpigmentation: Secondary | ICD-10-CM | POA: Diagnosis not present

## 2021-11-29 ENCOUNTER — Other Ambulatory Visit: Payer: Self-pay | Admitting: Family Medicine

## 2021-11-29 DIAGNOSIS — K222 Esophageal obstruction: Secondary | ICD-10-CM

## 2021-12-12 ENCOUNTER — Ambulatory Visit (INDEPENDENT_AMBULATORY_CARE_PROVIDER_SITE_OTHER): Payer: Medicare Other

## 2021-12-12 ENCOUNTER — Ambulatory Visit (INDEPENDENT_AMBULATORY_CARE_PROVIDER_SITE_OTHER): Payer: Medicare Other | Admitting: Family Medicine

## 2021-12-12 ENCOUNTER — Encounter: Payer: Self-pay | Admitting: Family Medicine

## 2021-12-12 VITALS — BP 118/64 | HR 61 | Temp 97.1°F | Wt 117.8 lb

## 2021-12-12 DIAGNOSIS — K219 Gastro-esophageal reflux disease without esophagitis: Secondary | ICD-10-CM | POA: Insufficient documentation

## 2021-12-12 DIAGNOSIS — I471 Supraventricular tachycardia: Secondary | ICD-10-CM

## 2021-12-12 DIAGNOSIS — M5481 Occipital neuralgia: Secondary | ICD-10-CM | POA: Diagnosis not present

## 2021-12-12 DIAGNOSIS — M4312 Spondylolisthesis, cervical region: Secondary | ICD-10-CM | POA: Diagnosis not present

## 2021-12-12 DIAGNOSIS — E782 Mixed hyperlipidemia: Secondary | ICD-10-CM

## 2021-12-12 DIAGNOSIS — Z23 Encounter for immunization: Secondary | ICD-10-CM

## 2021-12-12 DIAGNOSIS — M47812 Spondylosis without myelopathy or radiculopathy, cervical region: Secondary | ICD-10-CM | POA: Diagnosis not present

## 2021-12-12 DIAGNOSIS — M542 Cervicalgia: Secondary | ICD-10-CM | POA: Diagnosis not present

## 2021-12-12 NOTE — Progress Notes (Unsigned)
Initial visit for persistent neck pain x 3 months Denies injury
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
## 2021-12-22 DIAGNOSIS — M5481 Occipital neuralgia: Secondary | ICD-10-CM | POA: Diagnosis not present

## 2021-12-26 DIAGNOSIS — R92323 Mammographic fibroglandular density, bilateral breasts: Secondary | ICD-10-CM | POA: Diagnosis not present

## 2021-12-26 DIAGNOSIS — Z1231 Encounter for screening mammogram for malignant neoplasm of breast: Secondary | ICD-10-CM | POA: Diagnosis not present

## 2021-12-26 DIAGNOSIS — Z9882 Breast implant status: Secondary | ICD-10-CM | POA: Diagnosis not present

## 2021-12-26 LAB — HM MAMMOGRAPHY

## 2021-12-27 ENCOUNTER — Encounter: Payer: Self-pay | Admitting: Family Medicine

## 2022-01-08 DIAGNOSIS — M5481 Occipital neuralgia: Secondary | ICD-10-CM | POA: Diagnosis not present

## 2022-02-16 DIAGNOSIS — M5481 Occipital neuralgia: Secondary | ICD-10-CM | POA: Diagnosis not present

## 2022-03-20 ENCOUNTER — Other Ambulatory Visit: Payer: Self-pay | Admitting: Internal Medicine

## 2022-03-28 ENCOUNTER — Other Ambulatory Visit: Payer: Self-pay | Admitting: Family Medicine

## 2022-05-04 DIAGNOSIS — L821 Other seborrheic keratosis: Secondary | ICD-10-CM | POA: Diagnosis not present

## 2022-05-04 DIAGNOSIS — L814 Other melanin hyperpigmentation: Secondary | ICD-10-CM | POA: Diagnosis not present

## 2022-05-04 DIAGNOSIS — D225 Melanocytic nevi of trunk: Secondary | ICD-10-CM | POA: Diagnosis not present

## 2022-06-09 IMAGING — CT CT CARDIAC CORONARY ARTERY CALCIUM SCORE
3 series · 14 of 20 positions shown, 16 images · non-contrast
Comparison: None.

CLINICAL DATA: Mixed hyperlipidemia

EXAM:
CT CARDIAC CORONARY ARTERY CALCIUM SCORE
TECHNIQUE: Non-contrast imaging through the heart was performed using
prospective ECG gating. Image post processing was performed on an
independent workstation, allowing for quantitative analysis of the
heart and coronary arteries. Note that this exam targets the heart
and the chest was not imaged in its entirety.

[Series 2: calcium scoring 2.00 qr36 bestdiast 70% hrt calciu · axial · 0.34mm/px · z∈[+1625,+1721]mm · 4 of 80 slices shown]
[im 16/80  vessel]
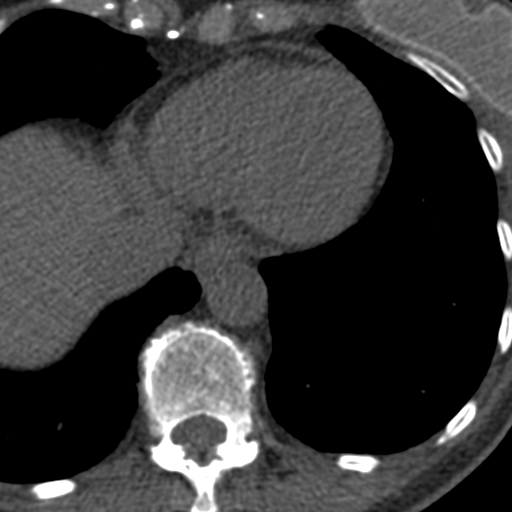
[im 32/80  vessel]
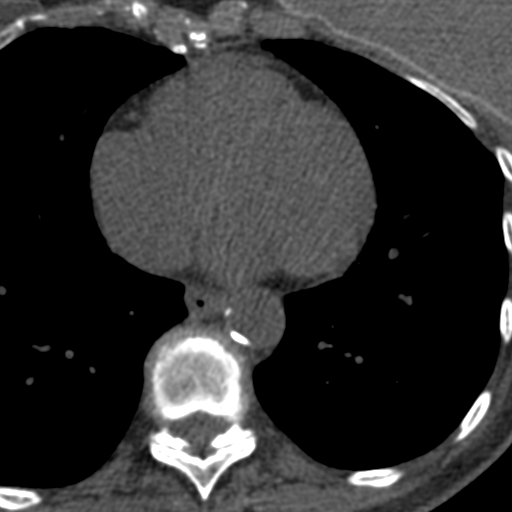
[im 48/80  vessel]
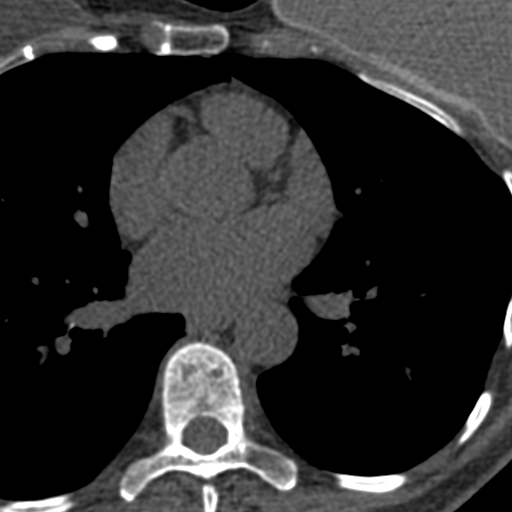
[im 64/80  vessel]
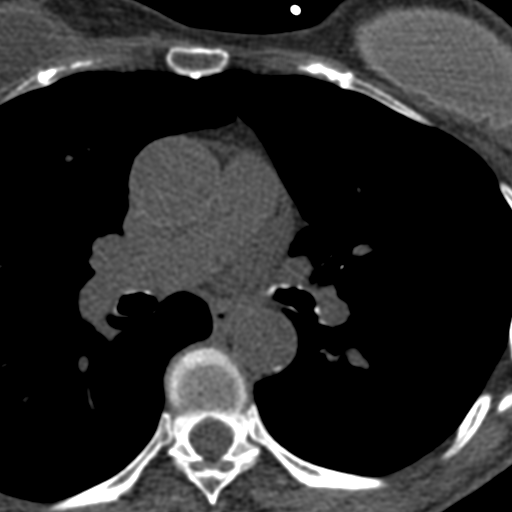

[Series 3: calcium scoring 2.00 br40 bestdiast 70% axial · axial · 0.47mm/px · z∈[+1623,+1727]mm · 5 of 79 slices shown, 7 images]
[im 14/79  vessel]
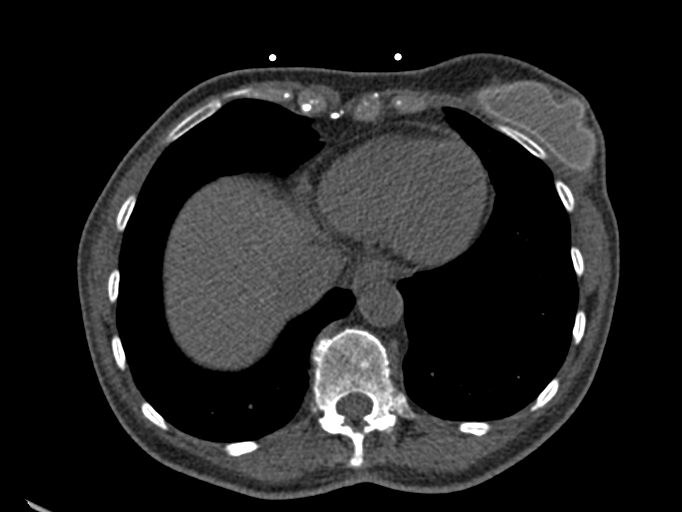
[im 14/79  lung]
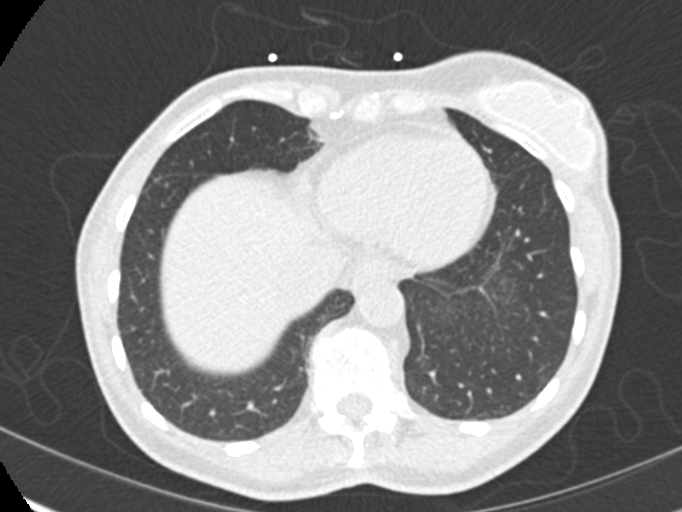
[im 27/79  vessel]
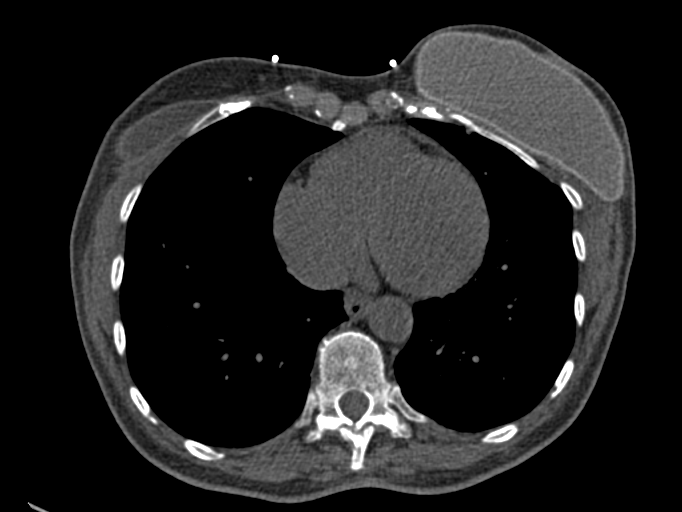
[im 40/79  vessel]
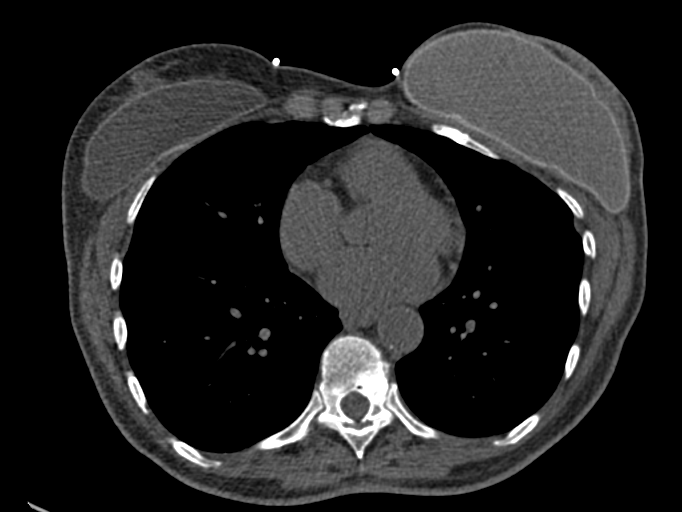
[im 53/79  vessel]
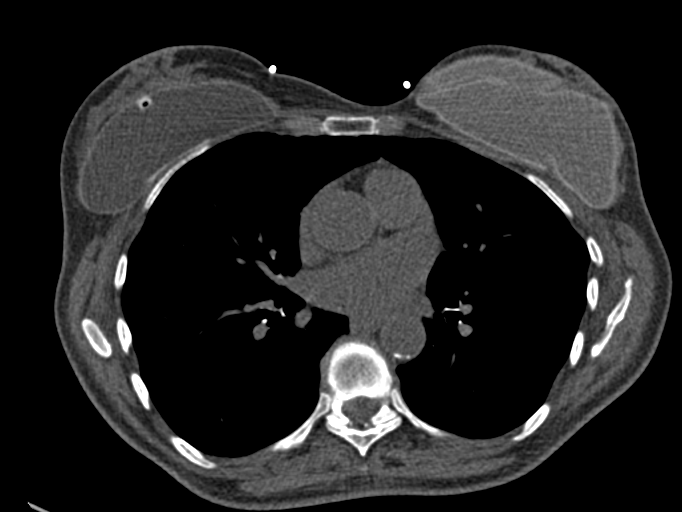
[im 66/79  vessel]
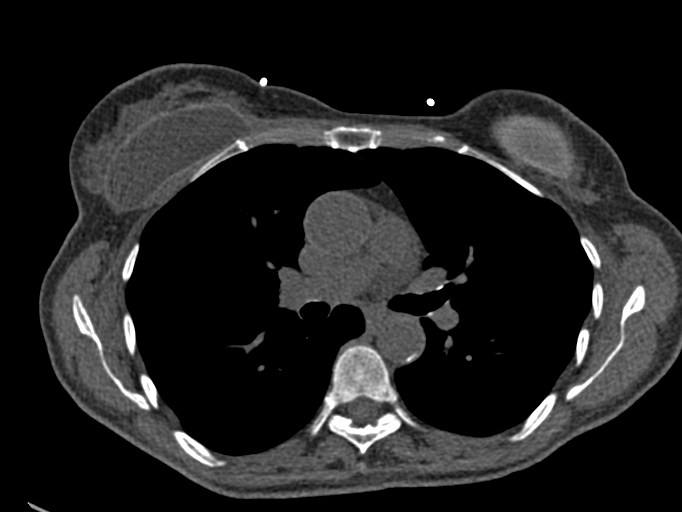
[im 66/79  lung]
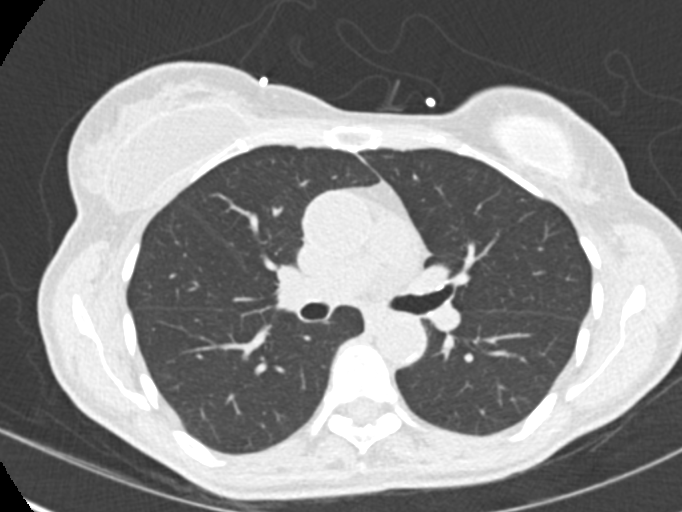

[Series 9: calcium scoring 2.00 br60 bestdiast 70% lungs · axial · 0.47mm/px · z∈[+1623,+1727]mm · 5 of 79 slices shown]
[im 14/79  vessel]
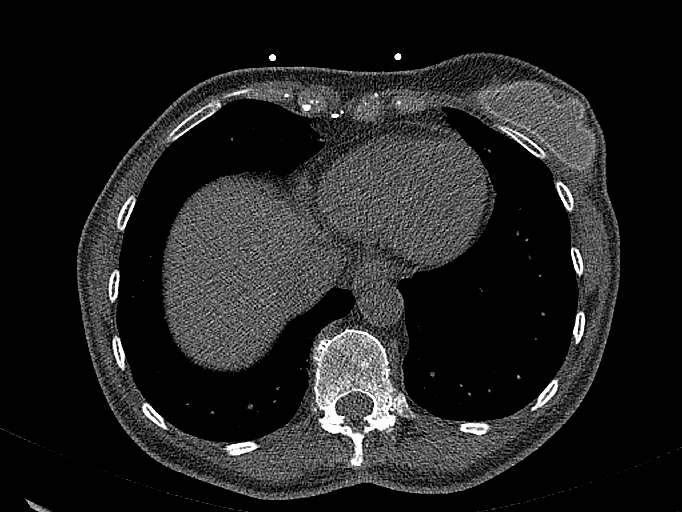
[im 27/79  vessel]
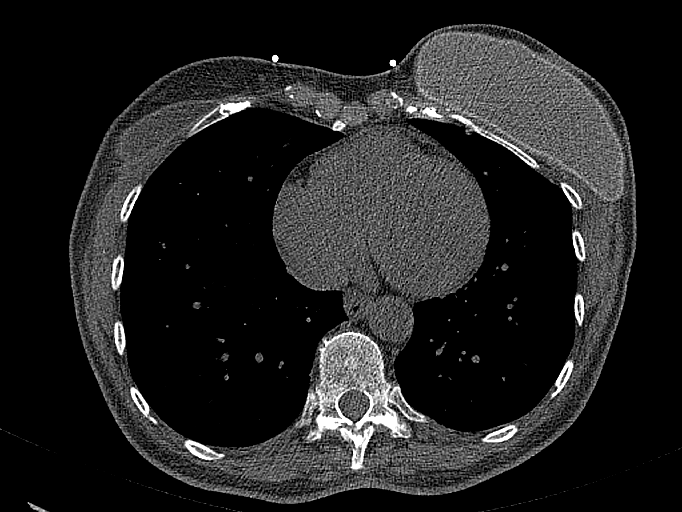
[im 40/79  vessel]
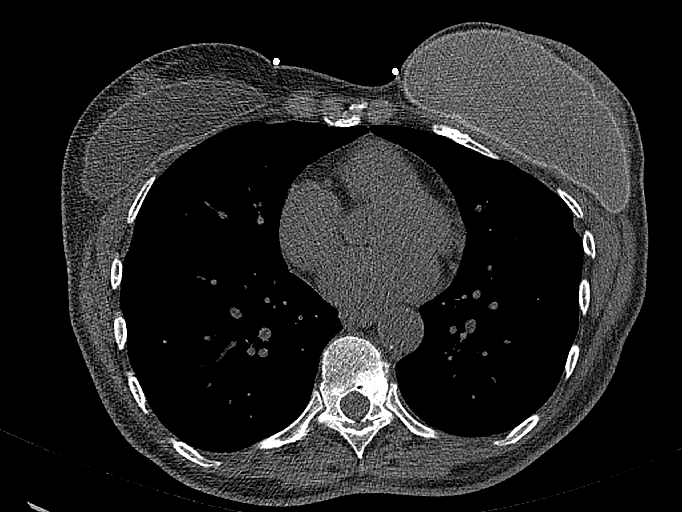
[im 53/79  vessel]
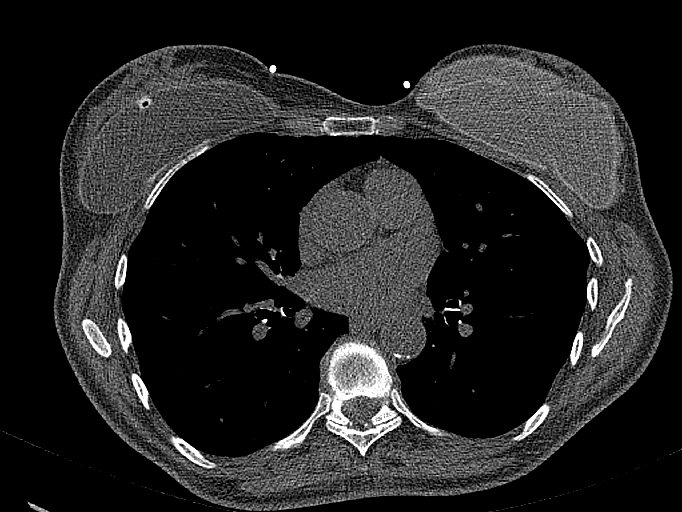
[im 66/79  vessel]
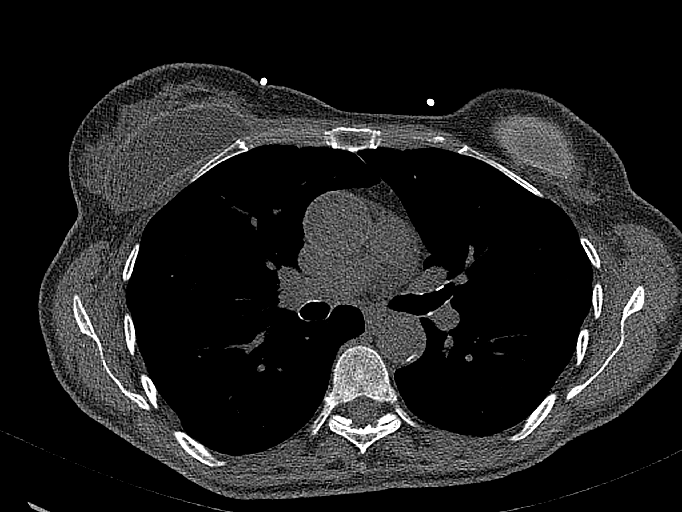

[14 of 20 positions shown; findings below may reference images not displayed]

FINDINGS: CORONARY CALCIUM SCORES:

Left Main: 0

LAD: 0

LCx: 0

RCA: 0

Total Agatston Score: 0

[HOSPITAL] percentile: 0

AORTA MEASUREMENTS:

Ascending Aorta: 0 29 mm

Descending Aorta: 20 mm

OTHER FINDINGS:

Heart is normal size. Aorta normal caliber. Scattered calcifications
in the descending thoracic aorta. No adenopathy. No confluent
airspace opacities or effusions. No acute findings in the upper
abdomen. Bilateral breast implants. No acute bony abnormality.
IMPRESSION: No visible coronary artery calcifications. Total coronary calcium
score of 0.

No acute or significant extracardiac abnormality.

Aortic atherosclerosis.

No acute extra cardiac abnormality.

## 2022-06-12 ENCOUNTER — Encounter: Payer: Self-pay | Admitting: Family Medicine

## 2022-06-12 ENCOUNTER — Ambulatory Visit (INDEPENDENT_AMBULATORY_CARE_PROVIDER_SITE_OTHER): Payer: Medicare Other | Admitting: Family Medicine

## 2022-06-12 VITALS — BP 116/68 | HR 55 | Temp 97.5°F | Ht 66.0 in | Wt 118.2 lb

## 2022-06-12 DIAGNOSIS — I471 Supraventricular tachycardia, unspecified: Secondary | ICD-10-CM | POA: Diagnosis not present

## 2022-06-12 DIAGNOSIS — E782 Mixed hyperlipidemia: Secondary | ICD-10-CM

## 2022-06-12 DIAGNOSIS — N951 Menopausal and female climacteric states: Secondary | ICD-10-CM | POA: Diagnosis not present

## 2022-06-12 DIAGNOSIS — M5481 Occipital neuralgia: Secondary | ICD-10-CM | POA: Insufficient documentation

## 2022-06-12 LAB — LIPID PANEL
Cholesterol: 220 mg/dL — ABNORMAL HIGH (ref 0–200)
HDL: 87.5 mg/dL (ref >39.00)
LDL Cholesterol: 114 mg/dL — ABNORMAL HIGH (ref 0–99)
NonHDL: 132.07
Total CHOL/HDL Ratio: 3
Triglycerides: 89 mg/dL (ref 0.0–149.0)
VLDL: 17.8 mg/dL (ref 0.0–40.0)

## 2022-06-12 MED ORDER — ESTRADIOL 0.075 MG/24HR TD PTTW: MEDICATED_PATCH | TRANSDERMAL | 3 refills | Status: DC

## 2022-06-12 NOTE — Progress Notes (Signed)
[facility] [address] [place] [address] Dept: [phone] Dept Fax: [phone]  Chronic Care Office Visit  Subjective:    Patient ID: Kristin Hinton, female    DOB: [birth year], 77 y.o..   MRN: [id number]  Chief Complaint  Patient presents with   Medical Management of Chronic Issues    6 month f/u.  No concerns.  Fasting today.    History of Present Illness:  Patient is in today for reassessment of chronic medical issues.  Kristin Hinton has a history of SVT. She manages this with propranolol. She admits to occasional flutter, but this is not frequent or bothersome.   Kristin Hinton has a history of  mild hyperlipidemia. Her cardiac CT last year showed a total calcium score of 0. She notes she continues to worry about if she might be developing ASCVD.  Kristin Hinton has a history of vasomotor symptoms of menopause. She is managed on an estradiol patch. She has tried to go without this int he past and had flares of her hot flashes. She does plan to try and reduce this down to 1 patch a week.  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/2020   Lumbar radiculopathy 06/22/2020   Insomnia 05/28/2018   SVT (supraventricular tachycardia) 07/15/2016   LBBB (left bundle branch block) 07/15/2016   Schatzki's ring 07/15/2016   Past Surgical History:  Procedure Laterality Date   HYSTERECTOMY ABDOMINAL WITH SALPINGECTOMY  03/30/1991   LUMBAR DISC SURGERY  03/19/1997   MASTECTOMY Left    Due to surgical complications related to previous breast implant    PLACEMENT OF BREAST IMPLANTS  03/19/2013   TONSILLECTOMY  03/20/1983   Family History  Problem Relation Age of Onset   Congenital heart disease Mother    Congestive Heart Failure Mother    Hyperlipidemia Mother    Hypertension Mother    Kidney disease Mother    Arthritis Mother    Hearing loss Mother    Cancer Father        Lung   Breast cancer Sister    Cancer Brother        Lung   Alcohol abuse Brother    Drug abuse Brother    Early death Brother    Heart attack Brother    Alcohol abuse Brother    Early death Brother    Hypercholesterolemia Son    Hypertension Son    Heart attack Paternal Aunt    Stroke Maternal Grandmother    Arthritis Maternal Grandmother    Hypercholesterolemia Maternal Grandmother    Hypertension Maternal Grandmother    Arthritis Paternal Grandmother    Heart attack Paternal Grandmother    Hypercholesterolemia Paternal Grandmother    Hypertension Paternal Grandmother    Outpatient Medications Prior to Visit  Medication Sig Dispense Refill   Azelaic Acid-Niacinamide 15-4 % CREA Apply     estradiol (VIVELLE-DOT) 0.075 MG/24HR APPLY 1 PATCH TO SKIN TWICE A WEEK 24 patch 1   fluticasone (FLONASE) 50 MCG/ACT nasal spray USE 2 SPRAYS IN EACH NOSTRIL DAILY 48 g 3   Multiple Vitamin (MULTIVITAMIN) tablet Take 1 tablet by mouth  daily.     pantoprazole (PROTONIX) 40 MG tablet TAKE 1 TABLET TWICE A DAY BEFORE MEALS 180 tablet 2   propranolol ER (INDERAL LA) 60 MG 24 hr capsule TAKE 1 CAPSULE DAILY 90 capsule 0   triamcinolone cream (KENALOG) 0.1 % Apply 1 application topically 2 (two) times daily. 30 g 0   No facility-administered medications prior to visit.   Allergies  Allergen Reactions   Molds & Smuts Itching   Pollen Extract Itching   Objective:   Today's Vitals   06/12/22 0959  BP: 116/68  Pulse: (!) 55  Temp: (!) 97.5 F (36.4 C)  TempSrc: Temporal  SpO2: 98%  Weight: 118 lb 3.2 oz (53.6 kg)  Height: 5\' 6"  (1.676 m)   Body mass index  is 19.08 kg/m.   General: Well developed, well nourished. No acute distress. Psych: Alert and oriented. Normal mood and affect.  There are no preventive care reminders to display for this patient.     Assessment & Plan:   Problem List Items Addressed This Visit       Cardiovascular and Mediastinum   SVT (supraventricular tachycardia) - Primary    Stable. Continue propranolol ER 60 mg daily.        Other   Symptomatic menopausal or female climacteric states    Stable. As she is symptomatic without estrogen, I will continue her estrogen patch.      Relevant Medications   estradiol (VIVELLE-DOT) 0.075 MG/24HR   Hyperlipidemia    I reassured her that her CAC scan was normal last year. I will check her fasting lipids today.      Relevant Orders   Lipid panel   Return in about 6 months (around 12/13/2022) for Reassessment.   [name], MD

## 2022-06-12 NOTE — Assessment & Plan Note (Signed)
Stable. As she is symptomatic without estrogen, I will continue her estrogen patch.

## 2022-06-12 NOTE — Assessment & Plan Note (Signed)
Stable. Continue propranolol ER 60 mg daily.

## 2022-06-12 NOTE — Assessment & Plan Note (Signed)
I reassured her that her CAC scan was normal last year. I will check her fasting lipids today.

## 2022-06-18 ENCOUNTER — Other Ambulatory Visit: Payer: Self-pay | Admitting: Internal Medicine

## 2022-07-02 ENCOUNTER — Encounter: Payer: Self-pay | Admitting: *Deleted

## 2022-07-11 NOTE — Progress Notes (Unsigned)
Cardiology Office Note Date:  07/11/2022  Patient ID:  Kristin Hinton, Kristin Hinton [birth year], MRN [id number] PCP:  Loyola Mast, MD  Cardiologist:  Dr. Duke Salvia Electrophysiologist; Dr. Graciela Husbands   Chief Complaint:  *** annual EP f/u  History of Present Illness: Kristin Hinton is a 77 y.o. female with history of HLD, Schatzki's ring, LBBB, SVT    She had been fairly well controlled with low dose BB for her SVT though in the last year increasing frequency.  Her BB uptitrated though intolerant with fatigue, unable to exercise.  Changed to dilt, though BP did not tolerate titration.   She saw Dr. Graciela Husbands 11/2017.  She was not interested in ablation at that time.  She was referred for a ZIO Patch that revealed mostly sinus rhythms with isolated PACs and a nonsustained atrial tachycardia with aberrancy.  He gave her prescriptions of propranolol, nebivolol, and bisoprolol. At her f/u with Dr. Duke Salvia she was on propanolol and doing a bit better though still with daily fleeting palpitations, her propanolol up-titrated slightly to avoid hypotension with plans to c/w EP going forward.  Dr. Graciela Husbands reported in his note [facility] 2017 as SVT.  Cover sheet was reviewed and described SVT at 200 bpm with some aberration as well as SVT at 135 with possible atrial tachycardia.    I saw her Dec 2019, she was accompanied by her husband.  She tells me she could not recall life without palpitations, these date back to her 73's.  Her fatigue went back to then as well and had been attributed to her tachycardia.  She reported back in the beginning she would cough and it would stop, in the last several years this has been ineffective.  She has not tried any other vagal maneuvers.  She tried one of the other drug options that Dr. Graciela Husbands gave her and it made her fatigue significantly worse, the other was not a covered drug for her.  She feels like the propanolol had perhaps slowed the rate of her tachycardia but not the  frequency of it.  Repored feeling her heart pounding out of her chest especially at night and most days feel like she has it.  She mentions as well episodes daily of feeling lightheaded, not near syncopal, but says if she were to be driving would pull over.  Unclear how long she has had these, but they are not associated with any awareness of her palpitations. No CP or SOB.  She was not sleeping well for a couple reasons, often with palpitations, wakes to use the bathroom, and sometimes just trouble sleeping.  She mentioned last she spoke with Dr. Graciela Husbands they discussed ablation, and having her see D. Ladona Ridgel but she was not quite ready, worries about procedures and risks.  Though felt like she may be getting to the point where she would like to hear more about the ablation option.  We planned for 48hr monitor given her daily lightheaded spells it seems post Zio monitor to evaluate firther, also discussed having her see Dr. Ladona Ridgel, though does not look like she did. Monitor noted Atach, symptoms strips were SR, an no noted symptoms with the AT episodes, Dr. Graciela Husbands recommended rate control  I saw her 04/28/19 She comes in feeling very well !  She is walking in the treadmill almost daily with good exertional capacity, no CP or SOB.  She says her palpitations have really settled down, no ongoing issues with dizzy spells, no near syncope or syncope She  tells me she is feeling very good and tolerating her propanolol well. She had labs done with her PMD in Nov or Dec and was told they were all "OK". No changes were made, planned for an annual visit.  I saw her 04/29/20 Again her husband accompanies her and she has continued to do very well./ Minimal, far and few palpitations No CP, SOB No dizziness, near syncope or syncope. She remains very active, walking thetreadmill most days. No exertional intolerances.  She saw E. Dick, NP 05/17/21, doing well, rare palpitations, very active with exercising, no concerns,  EKG was without PVCs, tolerating meds, no changes were made.  *** symptoms, palps *** meds *** labs, lipids...  Past Medical History:  Diagnosis Date   History of chicken pox    Hyperlipidemia    LBBB (left bundle branch block)    Migraines    Schatzki's ring    SVT (supraventricular tachycardia)    Tachycardia     Past Surgical History:  Procedure Laterality Date   HYSTERECTOMY ABDOMINAL WITH SALPINGECTOMY  03/30/1991   LUMBAR DISC SURGERY  03/19/1997   MASTECTOMY Left    Due to surgical complications related to previous breast implant   PLACEMENT OF BREAST IMPLANTS  03/19/2013   TONSILLECTOMY  03/20/1983    Current Outpatient Medications  Medication Sig Dispense Refill   Azelaic Acid-Niacinamide 15-4 % CREA Apply     estradiol (VIVELLE-DOT) 0.075 MG/24HR APPLY 1 PATCH TO SKIN TWICE A WEEK 24 patch 3   fluticasone (FLONASE) 50 MCG/ACT nasal spray USE 2 SPRAYS IN EACH NOSTRIL DAILY 48 g 3   Multiple Vitamin (MULTIVITAMIN) tablet Take 1 tablet by mouth daily.     pantoprazole (PROTONIX) 40 MG tablet TAKE 1 TABLET TWICE A DAY BEFORE MEALS 180 tablet 2   propranolol ER (INDERAL LA) 60 MG 24 hr capsule TAKE 1 CAPSULE DAILY. ( PLEASE SCHEDULE APPOINTMENT FOR FUTURE REFILLS ) 90 capsule 3   triamcinolone cream (KENALOG) 0.1 % Apply 1 application topically 2 (two) times daily. 30 g 0   No current facility-administered medications for this visit.    Allergies:   Molds & smuts and Pollen extract   Social History:  The patient  reports that she has never smoked. She has never used smokeless tobacco. She reports current alcohol use. She reports that she does not use drugs.   Family History:  The patient's family history includes Alcohol abuse in her brother and brother; Arthritis in her maternal grandmother, mother, and paternal grandmother; Breast cancer in her sister; Cancer in her brother and father; Congenital heart disease in her mother; Congestive Heart Failure in her mother;  Drug abuse in her brother; Early death in her brother and brother; Hearing loss in her mother; Heart attack in her brother, paternal aunt, and paternal grandmother; Hypercholesterolemia in her maternal grandmother, paternal grandmother, and son; Hyperlipidemia in her mother; Hypertension in her maternal grandmother, mother, paternal grandmother, and son; Kidney disease in her mother; Stroke in her maternal grandmother.  ROS:  Please see the history of present illness.  All other systems are reviewed and otherwise negative.   PHYSICAL EXAM:  VS:  There were no vitals taken for this visit. BMI: There is no height or weight on file to calculate BMI. Well nourished, well developed, in no acute distress, appears younger then her age HEENT: normocephalic, atraumatic  Neck: no JVD, carotid bruits or masses Cardiac: *** RRR; no significant murmurs, no rubs, or gallops Lungs: *** CTA b/l, no wheezing,  rhonchi or rales  Abd: soft, nontender MS: no deformity or atrophy Ext: *** no edema  Skin: warm and dry, no rash Neuro:  No gross deficits appreciated Psych: euthymic mood, full affect   EKG:  Done today an reviewed by myself;  ***   Jan 2020: 48hr monitor Dominant Rhythm  Sinus   Mean HR 68 Range HR 51-137   Total beats 180 K PVC 0 PAC 122   Tachycardia MULTIPLE WITH RATE RELATED ABERRATION  Appears to be atrial tachycardia   Pauses none     Symptoms noted palpitations were associated with NSR  No symptoms reported with SVT      Rate control     Ziopatch 09/27/17:  Duration: 14d Findings Symptoms  Racing Fluttering >> sinus mostly but not always with isolated PAC Symptom unspecified  Nonsustained atrial tach (wide with aberration) 8 bts >>150 bpm  No symptoms atrial tach with variable conduction  AA interval about 400 msec with variable conduction   17 bts At night  Conclusions  Her daily symptoms of fluttering and racing is most consistent with Isolated PACs in sinus rhtyhm  Some nonsustained atrial tachy-- brief and relatively slow- much less frequent    2017 Zio patch SR PVCs/PACs 113 runs of WCT, suspect SVT with rate related abberancy   Echo 07/21/15: LVEF 55%. Mild MR/TR. RVSP 25   Cardiac CT-A 04/28/15: Limited by artifact engaging  Cardiac catheterization 08/01/15: Normal coronary arteries. LVEF 60%.    Recent Labs: No results found for requested labs within last 365 days.  06/12/2022: Cholesterol 220; HDL 87.50; LDL Cholesterol 114; Total CHOL/HDL Ratio 3; Triglycerides 89.0; VLDL 17.8   CrCl cannot be calculated (Patient's most recent lab result is older than the maximum 21 days allowed.).   Wt Readings from Last 3 Encounters:  06/12/22 118 lb 3.2 oz (53.6 kg)  12/12/21 117 lb 12.8 oz (53.4 kg)  10/17/21 116 lb 9.6 oz (52.9 kg)     Other studies reviewed: Additional studies/records reviewed today include: summarized above  ASSESSMENT AND PLAN:  1. Palpitations 2. SVT      A Tach with abberations 3. PACs      ***    Disposition: *** we will continue annual visits      Current medicines are reviewed at length with the patient today.  The patient did not have any concerns regarding medicines.  Norma Fredrickson, PA-C 07/11/2022 3:10 PM     [facility] [address] [place] [address] [phone] (office)  [phone] (fax)

## 2022-07-12 ENCOUNTER — Encounter: Payer: Self-pay | Admitting: Physician Assistant

## 2022-07-12 ENCOUNTER — Ambulatory Visit: Payer: Medicare Other | Attending: Physician Assistant | Admitting: Physician Assistant

## 2022-07-12 VITALS — BP 110/72 | HR 65 | Ht 66.0 in | Wt 117.4 lb

## 2022-07-12 DIAGNOSIS — I471 Supraventricular tachycardia, unspecified: Secondary | ICD-10-CM | POA: Diagnosis not present

## 2022-07-12 DIAGNOSIS — I491 Atrial premature depolarization: Secondary | ICD-10-CM | POA: Diagnosis not present

## 2022-07-12 DIAGNOSIS — R002 Palpitations: Secondary | ICD-10-CM | POA: Diagnosis not present

## 2022-07-12 NOTE — Patient Instructions (Addendum)
Medication Instructions:   Your physician recommends that you continue on your current medications as directed. Please refer to the Current Medication list given to you today.   *If you need a refill on your cardiac medications before your next appointment, please call your pharmacy*   Lab Work: NONE ORDERED  TODAY   If you have labs (blood work) drawn today and your tests are completely normal, you will receive your results only by: MyChart Message (if you have MyChart) OR A paper copy in the mail If you have any lab test that is abnormal or we need to change your treatment, we will call you to review the results.   Testing/Procedures:  NONE ORDERED  TODAY     Follow-Up: At [facility], you and your health needs are our priority.  As part of our continuing mission to provide you with exceptional heart care, we have created designated Provider Care Teams.  These Care Teams include your primary Cardiologist (physician) and Advanced Practice Providers (APPs -  Physician Assistants and Nurse Practitioners) who all work together to provide you with the care you need, when you need it.  We recommend signing up for the patient portal called "MyChart".  Sign up information is provided on this After Visit Summary.  MyChart is used to connect with patients for Virtual Visits (Telemedicine).  Patients are able to view lab/test results, encounter notes, upcoming appointments, etc.  Non-urgent messages can be sent to your provider as well.   To learn more about what you can do with MyChart, go to [email or web address].    Your next appointment:    1 year(s)  Provider:   Francis Dowse, PA-C

## 2022-08-06 ENCOUNTER — Other Ambulatory Visit: Payer: Self-pay | Admitting: Family Medicine

## 2022-08-06 DIAGNOSIS — K222 Esophageal obstruction: Secondary | ICD-10-CM

## 2022-08-24 ENCOUNTER — Other Ambulatory Visit: Payer: Self-pay | Admitting: Family Medicine

## 2022-08-24 DIAGNOSIS — N951 Menopausal and female climacteric states: Secondary | ICD-10-CM

## 2022-08-30 ENCOUNTER — Other Ambulatory Visit: Payer: Self-pay | Admitting: Family Medicine

## 2022-11-01 ENCOUNTER — Encounter (INDEPENDENT_AMBULATORY_CARE_PROVIDER_SITE_OTHER): Payer: Self-pay

## 2022-11-05 ENCOUNTER — Ambulatory Visit (INDEPENDENT_AMBULATORY_CARE_PROVIDER_SITE_OTHER): Payer: Medicare Other

## 2022-11-05 DIAGNOSIS — Z0000 Encounter for general adult medical examination without abnormal findings: Secondary | ICD-10-CM | POA: Diagnosis not present

## 2022-11-05 NOTE — Patient Instructions (Signed)
Kristin Hinton , Thank you for taking time to come for your Medicare Wellness Visit. I appreciate your ongoing commitment to your health goals. Please review the following plan we discussed and let me know if I can assist you in the future.   Referrals/Orders/Follow-Ups/Clinician Recommendations: none  This is a list of the screening recommended for you and due dates:  Health Maintenance  Topic Date Due   COVID-19 Vaccine (6 - 2023-24 season) 11/17/2021   Flu Shot  10/18/2022   Medicare Annual Wellness Visit  11/05/2023   DTaP/Tdap/Td vaccine (3 - Td or Tdap) 01/18/2031   Pneumonia Vaccine  Completed   DEXA scan (bone density measurement)  Completed   Hepatitis C Screening  Completed   Zoster (Shingles) Vaccine  Completed   HPV Vaccine  Aged Out   Colon Cancer Screening  Discontinued    Advanced directives: (In Chart) A copy of your advanced directives are scanned into your chart should your provider ever need it.  Next Medicare Annual Wellness Visit scheduled for next year: Yes  Preventive Care 23 Years and Older, Female Preventive care refers to lifestyle choices and visits with your health care provider that can promote health and wellness. What does preventive care include? A yearly physical exam. This is also called an annual well check. Dental exams once or twice a year. Routine eye exams. Ask your health care provider how often you should have your eyes checked. Personal lifestyle choices, including: Daily care of your teeth and gums. Regular physical activity. Eating a healthy diet. Avoiding tobacco and drug use. Limiting alcohol use. Practicing safe sex. Taking low-dose aspirin every day. Taking vitamin and mineral supplements as recommended by your health care provider. What happens during an annual well check? The services and screenings done by your health care provider during your annual well check will depend on your age, overall health, lifestyle risk factors, and  family history of disease. Counseling  Your health care provider may ask you questions about your: Alcohol use. Tobacco use. Drug use. Emotional well-being. Home and relationship well-being. Sexual activity. Eating habits. History of falls. Memory and ability to understand (cognition). Work and work Astronomer. Reproductive health. Screening  You may have the following tests or measurements: Height, weight, and BMI. Blood pressure. Lipid and cholesterol levels. These may be checked every 5 years, or more frequently if you are over 64 years old. Skin check. Lung cancer screening. You may have this screening every year starting at age 36 if you have a 30-pack-year history of smoking and currently smoke or have quit within the past 15 years. Fecal occult blood test (FOBT) of the stool. You may have this test every year starting at age 97. Flexible sigmoidoscopy or colonoscopy. You may have a sigmoidoscopy every 5 years or a colonoscopy every 10 years starting at age 28. Hepatitis C blood test. Hepatitis B blood test. Sexually transmitted disease (STD) testing. Diabetes screening. This is done by checking your blood sugar (glucose) after you have not eaten for a while (fasting). You may have this done every 1-3 years. Bone density scan. This is done to screen for osteoporosis. You may have this done starting at age 61. Mammogram. This may be done every 1-2 years. Talk to your health care provider about how often you should have regular mammograms. Talk with your health care provider about your test results, treatment options, and if necessary, the need for more tests. Vaccines  Your health care provider may recommend certain vaccines, such  as: Influenza vaccine. This is recommended every year. Tetanus, diphtheria, and acellular pertussis (Tdap, Td) vaccine. You may need a Td booster every 10 years. Zoster vaccine. You may need this after age 80. Pneumococcal 13-valent conjugate  (PCV13) vaccine. One dose is recommended after age 13. Pneumococcal polysaccharide (PPSV23) vaccine. One dose is recommended after age 38. Talk to your health care provider about which screenings and vaccines you need and how often you need them. This information is not intended to replace advice given to you by your health care provider. Make sure you discuss any questions you have with your health care provider. Document Released: 04/01/2015 Document Revised: 11/23/2015 Document Reviewed: 01/04/2015 Elsevier Interactive Patient Education  2017 [employer].  Fall Prevention in the Home Falls can cause injuries. They can happen to people of all ages. There are many things you can do to make your home safe and to help prevent falls. What can I do on the outside of my home? Regularly fix the edges of walkways and driveways and fix any cracks. Remove anything that might make you trip as you walk through a door, such as a raised step or threshold. Trim any bushes or trees on the path to your home. Use bright outdoor lighting. Clear any walking paths of anything that might make someone trip, such as rocks or tools. Regularly check to see if handrails are loose or broken. Make sure that both sides of any steps have handrails. Any raised decks and porches should have guardrails on the edges. Have any leaves, snow, or ice cleared regularly. Use sand or salt on walking paths during winter. Clean up any spills in your garage right away. This includes oil or grease spills. What can I do in the bathroom? Use night lights. Install grab bars by the toilet and in the tub and shower. Do not use towel bars as grab bars. Use non-skid mats or decals in the tub or shower. If you need to sit down in the shower, use a plastic, non-slip stool. Keep the floor dry. Clean up any water that spills on the floor as soon as it happens. Remove soap buildup in the tub or shower regularly. Attach bath mats securely with  double-sided non-slip rug tape. Do not have throw rugs and other things on the floor that can make you trip. What can I do in the bedroom? Use night lights. Make sure that you have a light by your bed that is easy to reach. Do not use any sheets or blankets that are too big for your bed. They should not hang down onto the floor. Have a firm chair that has side arms. You can use this for support while you get dressed. Do not have throw rugs and other things on the floor that can make you trip. What can I do in the kitchen? Clean up any spills right away. Avoid walking on wet floors. Keep items that you use a lot in easy-to-reach places. If you need to reach something above you, use a strong step stool that has a grab bar. Keep electrical cords out of the way. Do not use floor polish or wax that makes floors slippery. If you must use wax, use non-skid floor wax. Do not have throw rugs and other things on the floor that can make you trip. What can I do with my stairs? Do not leave any items on the stairs. Make sure that there are handrails on both sides of the stairs and use  them. Fix handrails that are broken or loose. Make sure that handrails are as long as the stairways. Check any carpeting to make sure that it is firmly attached to the stairs. Fix any carpet that is loose or worn. Avoid having throw rugs at the top or bottom of the stairs. If you do have throw rugs, attach them to the floor with carpet tape. Make sure that you have a light switch at the top of the stairs and the bottom of the stairs. If you do not have them, ask someone to add them for you. What else can I do to help prevent falls? Wear shoes that: Do not have high heels. Have rubber bottoms. Are comfortable and fit you well. Are closed at the toe. Do not wear sandals. If you use a stepladder: Make sure that it is fully opened. Do not climb a closed stepladder. Make sure that both sides of the stepladder are locked  into place. Ask someone to hold it for you, if possible. Clearly mark and make sure that you can see: Any grab bars or handrails. First and last steps. Where the edge of each step is. Use tools that help you move around (mobility aids) if they are needed. These include: Canes. Walkers. Scooters. Crutches. Turn on the lights when you go into a dark area. Replace any light bulbs as soon as they burn out. Set up your furniture so you have a clear path. Avoid moving your furniture around. If any of your floors are uneven, fix them. If there are any pets around you, be aware of where they are. Review your medicines with your doctor. Some medicines can make you feel dizzy. This can increase your chance of falling. Ask your doctor what other things that you can do to help prevent falls. This information is not intended to replace advice given to you by your health care provider. Make sure you discuss any questions you have with your health care provider. Document Released: 12/30/2008 Document Revised: 08/11/2015 Document Reviewed: 04/09/2014 Elsevier Interactive Patient Education  2017 [employer].

## 2022-11-05 NOTE — Progress Notes (Signed)
Subjective:   Kristin Hinton is a 77 y.o. female who presents for Medicare Annual (Subsequent) preventive examination.  Visit Complete: Virtual  I connected with  [name] on 11/05/22 by a audio enabled telemedicine application and verified that I am speaking with the correct person using two identifiers.  Patient Location: Home  Provider Location: Office/Clinic  I discussed the limitations of evaluation and management by telemedicine. The patient expressed understanding and agreed to proceed.  Vital Signs: Unable to obtain new vitals due to this being a telehealth visit.  Review of Systems     Cardiac Risk Factors include: advanced age (>4men, >28 women);dyslipidemia     Objective:    Today's Vitals   There is no height or weight on file to calculate BMI.     11/05/2022    1:02 PM 10/25/2021    9:42 AM 10/18/2020   11:29 AM 09/09/2019   11:20 AM 09/03/2018   10:05 AM  Advanced Directives  Does Patient Have a Medical Advance Directive? Yes Yes Yes Yes Yes  Type of Estate agent of [place];Living will Healthcare Power of [place];Living will Healthcare Power of [employer];Living will Healthcare Power of [place];Living will Living will  Does patient want to make changes to medical advance directive?     No - Patient declined  Copy of Healthcare Power of Attorney in Chart? Yes - validated most recent copy scanned in chart (See row information) No - copy requested Yes - validated most recent copy scanned in chart (See row information) No - copy requested     Current Medications (verified) Outpatient Encounter Medications as of 11/05/2022  Medication Sig   Azelaic Acid-Niacinamide 15-4 % CREA Apply   estradiol (VIVELLE-DOT) 0.075 MG/24HR APPLY 1 PATCH TO SKIN TWICE A WEEK   fluticasone (FLONASE) 50 MCG/ACT nasal spray USE 2 SPRAYS IN EACH NOSTRIL DAILY   Multiple Vitamin (MULTIVITAMIN) tablet Take 1 tablet by mouth daily.   pantoprazole (PROTONIX) 40  MG tablet TAKE 1 TABLET TWICE A DAY BEFORE MEALS   propranolol ER (INDERAL LA) 60 MG 24 hr capsule TAKE 1 CAPSULE DAILY. ( PLEASE SCHEDULE APPOINTMENT FOR FUTURE REFILLS )   triamcinolone cream (KENALOG) 0.1 % Apply 1 application topically 2 (two) times daily.   No facility-administered encounter medications on file as of 11/05/2022.    Allergies (verified) Molds & smuts and Pollen extract   History: Past Medical History:  Diagnosis Date   History of chicken pox    Hyperlipidemia    LBBB (left bundle branch block)    Migraines    Schatzki's ring    SVT (supraventricular tachycardia)    Tachycardia    Past Surgical History:  Procedure Laterality Date   HYSTERECTOMY ABDOMINAL WITH SALPINGECTOMY  03/30/1991   LUMBAR DISC SURGERY  03/19/1997   MASTECTOMY Left    Due to surgical complications related to previous breast implant   PLACEMENT OF BREAST IMPLANTS  03/19/2013   TONSILLECTOMY  03/20/1983   Family History  Problem Relation Age of Onset   Congenital heart disease Mother    Congestive Heart Failure Mother    Hyperlipidemia Mother    Hypertension Mother    Kidney disease Mother    Arthritis Mother    Hearing loss Mother    Cancer Father        Lung   Breast cancer Sister    Cancer Brother        Lung   Alcohol abuse Brother    Drug abuse Brother  Early death Brother    Heart attack Brother    Alcohol abuse Brother    Early death Brother    Hypercholesterolemia Son    Hypertension Son    Heart attack Paternal Aunt    Stroke Maternal Grandmother    Arthritis Maternal Grandmother    Hypercholesterolemia Maternal Grandmother    Hypertension Maternal Grandmother    Arthritis Paternal Grandmother    Heart attack Paternal Grandmother    Hypercholesterolemia Paternal Grandmother    Hypertension Paternal Grandmother    Social History   Socioeconomic History   Marital status: Married    Spouse name: Not on file   Number of children: 2   Years of education:  Not on file   Highest education level: Bachelor's degree (e.g., BA, AB, BS)  Occupational History   Occupation: Retired  Tobacco Use   Smoking status: Never   Smokeless tobacco: Never  Vaping Use   Vaping status: Never Used  Substance and Sexual Activity   Alcohol use: Yes    Comment: 7-10 drinks per week   Drug use: Never   Sexual activity: Yes  Other Topics Concern   Not on file  Social History Narrative   Not on file   Social Determinants of Health   Financial Resource Strain: Low Risk  (11/05/2022)   Overall Financial Resource Strain (CARDIA)    Difficulty of Paying Living Expenses: Not hard at all  Food Insecurity: No Food Insecurity (11/05/2022)   Hunger Vital Sign    Worried About Running Out of Food in the Last Year: Never true    Ran Out of Food in the Last Year: Never true  Transportation Needs: No Transportation Needs (11/05/2022)   PRAPARE - Administrator, Civil Service (Medical): No    Lack of Transportation (Non-Medical): No  Physical Activity: Insufficiently Active (11/05/2022)   Exercise Vital Sign    Days of Exercise per Week: 4 days    Minutes of Exercise per Session: 30 min  Stress: No Stress Concern Present (11/05/2022)   [employer] of Occupational Health - Occupational Stress Questionnaire    Feeling of Stress : Not at all  Social Connections: Socially Isolated (11/05/2022)   Social Connection and Isolation Panel [NHANES]    Frequency of Communication with Friends and Family: Once a week    Frequency of Social Gatherings with Friends and Family: Once a week    Attends Religious Services: Never    Database administrator or Organizations: No    Attends Engineer, structural: Never    Marital Status: Married    Tobacco Counseling Counseling given: Not Answered   Clinical Intake:  Pre-visit preparation completed: Yes  Pain : No/denies pain     Nutritional Risks: None Diabetes: No  How often do you need to have  someone help you when you read instructions, pamphlets, or other written materials from your doctor or pharmacy?: 1 - Never  Interpreter Needed?: No  Information entered by :: NAllen LPN   Activities of Daily Living    11/05/2022   12:57 PM  In your present state of health, do you have any difficulty performing the following activities:  Hearing? 0  Vision? 0  Difficulty concentrating or making decisions? 0  Walking or climbing stairs? 0  Dressing or bathing? 0  Doing errands, shopping? 0  Preparing Food and eating ? N  Using the Toilet? N  In the past six months, have you accidently leaked urine? N  Do you have problems with loss of bowel control? N  Managing your Medications? N  Managing your Finances? N  Housekeeping or managing your Housekeeping? N    Patient Care Team: Loyola Mast, MD as PCP - General (Family Medicine) Myra Rude, MD (Inactive) as Consulting Physician (Family Medicine) Wynona Canes, MD as Referring Physician (Dermatology) Sallye Lat, MD as Consulting Physician (Ophthalmology) Duke Salvia, MD as Consulting Physician (Cardiology)  Indicate any recent Medical Services you may have received from other than [facility] providers in the past year (date may be approximate).     Assessment:   This is a routine wellness examination for Kristin Hinton.  Hearing/Vision screen Hearing Screening - Comments:: Denies hearing issues Vision Screening - Comments:: Regular eye exams, [facility]  Dietary issues and exercise activities discussed:     Goals Addressed             This Visit's Progress    Patient Stated       11/05/2022, stay healthy       Depression Screen    11/05/2022    1:03 PM 06/12/2022    9:59 AM 10/25/2021    9:42 AM 10/25/2021    9:40 AM 06/08/2021    8:53 AM 10/18/2020   11:34 AM 09/09/2019   11:24 AM  PHQ 2/9 Scores  PHQ - 2 Score 0 0 0 0 0 0 0  PHQ- 9 Score 4          Fall Risk    11/05/2022    1:03 PM 06/12/2022     9:59 AM 10/25/2021    9:42 AM 06/08/2021    8:53 AM 10/18/2020   11:28 AM  Fall Risk   Falls in the past year? 0 0 0 0 0  Number falls in past yr: 0 0 0 0 0  Injury with Fall? 0 0 0 0 0  Risk for fall due to : Medication side effect No Fall Risks  No Fall Risks   Follow up Falls prevention discussed Falls evaluation completed Falls evaluation completed;Education provided Falls evaluation completed Falls evaluation completed;Falls prevention discussed    MEDICARE RISK AT HOME: Medicare Risk at Home Any stairs in or around the home?: Yes If so, are there any without handrails?: No Home free of loose throw rugs in walkways, pet beds, electrical cords, etc?: Yes Adequate lighting in your home to reduce risk of falls?: Yes Life alert?: No Use of a cane, walker or w/c?: No Grab bars in the bathroom?: No Shower chair or bench in shower?: Yes Elevated toilet seat or a handicapped toilet?: Yes  TIMED UP AND GO:  Was the test performed?  No    Cognitive Function:        11/05/2022    1:05 PM 09/09/2019   11:26 AM  6CIT Screen  What Year? 0 points 0 points  What month? 0 points 0 points  What time? 0 points 0 points  Count back from 20 0 points 0 points  Months in reverse 0 points 0 points  Repeat phrase 0 points 0 points  Total Score 0 points 0 points    Immunizations Immunization History  Administered Date(s) Administered   Fluad Quad(high Dose 65+) 12/24/2018, 12/09/2020, 12/12/2021   H1N1 01/22/2008   Influenza, High Dose Seasonal PF 02/16/2005, 02/21/2006, 01/09/2007, 11/27/2007, 01/22/2008, 11/15/2008, 11/09/2009, 12/30/2010, 12/22/2012, 12/23/2013, 01/07/2015, 12/19/2015, 12/25/2016, 12/05/2017   Influenza-Unspecified 12/17/2019   PFIZER(Purple Top)SARS-COV-2 Vaccination 05/12/2019, 06/02/2019, 01/25/2020, 08/09/2020   Pfizer  Covid-19 Vaccine Bivalent Booster 65yrs & up 01/26/2021   Pneumococcal Conjugate-13 04/27/2013   Pneumococcal Polysaccharide-23 06/25/2011    Tdap 11/09/2009, 01/17/2021   Zoster Recombinant(Shingrix) 10/29/2016, 01/30/2017   Zoster, Live 10/22/2007    TDAP status: Up to date  Flu Vaccine status: Due, Education has been provided regarding the importance of this vaccine. Advised may receive this vaccine at local pharmacy or Health Dept. Aware to provide a copy of the vaccination record if obtained from local pharmacy or Health Dept. Verbalized acceptance and understanding.  Pneumococcal vaccine status: Up to date  Covid-19 vaccine status: Information provided on how to obtain vaccines.   Qualifies for Shingles Vaccine? Yes   Zostavax completed Yes   Shingrix Completed?: Yes  Screening Tests Health Maintenance  Topic Date Due   COVID-19 Vaccine (6 - 2023-24 season) 11/17/2021   INFLUENZA VACCINE  10/18/2022   Medicare Annual Wellness (AWV)  11/05/2023   DTaP/Tdap/Td (3 - Td or Tdap) 01/18/2031   Pneumonia Vaccine 64+ Years old  Completed   DEXA SCAN  Completed   Hepatitis C Screening  Completed   Zoster Vaccines- Shingrix  Completed   HPV VACCINES  Aged Out   Colonoscopy  Discontinued    Health Maintenance  Health Maintenance Due  Topic Date Due   COVID-19 Vaccine (6 - 2023-24 season) 11/17/2021   INFLUENZA VACCINE  10/18/2022    Colorectal cancer screening: No longer required.   Mammogram status: Completed 12/26/2021. Repeat every year  Bone Density status: Completed 12/16/2019.  Lung Cancer Screening: (Low Dose CT Chest recommended if Age 43-80 years, 20 pack-year currently smoking OR have quit w/in 15years.) does not qualify.   Lung Cancer Screening Referral: no  Additional Screening:  Hepatitis C Screening: does qualify; Completed 05/28/2018  Vision Screening: Recommended annual ophthalmology exams for early detection of glaucoma and other disorders of the eye. Is the patient up to date with their annual eye exam?  Yes  Who is the provider or what is the name of the office in which the patient  attends annual eye exams? [facility] If pt is not established with a provider, would they like to be referred to a provider to establish care? No .   Dental Screening: Recommended annual dental exams for proper oral hygiene  Diabetic Foot Exam: n/a  Community Resource Referral / Chronic Care Management: CRR required this visit?  No   CCM required this visit?  No     Plan:     I have personally reviewed and noted the following in the patient's chart:   Medical and social history Use of alcohol, tobacco or illicit drugs  Current medications and supplements including opioid prescriptions. Patient is not currently taking opioid prescriptions. Functional ability and status Nutritional status Physical activity Advanced directives List of other physicians Hospitalizations, surgeries, and ER visits in previous 12 months Vitals Screenings to include cognitive, depression, and falls Referrals and appointments  In addition, I have reviewed and discussed with patient certain preventive protocols, quality metrics, and best practice recommendations. A written personalized care plan for preventive services as well as general preventive health recommendations were provided to patient.     Barb Merino, LPN   2/44/0102   After Visit Summary: (MyChart) Due to this being a telephonic visit, the after visit summary with patients personalized plan was offered to patient via MyChart   Nurse Notes: none

## 2022-11-23 DIAGNOSIS — H43811 Vitreous degeneration, right eye: Secondary | ICD-10-CM | POA: Diagnosis not present

## 2022-11-23 DIAGNOSIS — H2513 Age-related nuclear cataract, bilateral: Secondary | ICD-10-CM | POA: Diagnosis not present

## 2022-12-06 DIAGNOSIS — L821 Other seborrheic keratosis: Secondary | ICD-10-CM | POA: Diagnosis not present

## 2022-12-06 DIAGNOSIS — D485 Neoplasm of uncertain behavior of skin: Secondary | ICD-10-CM | POA: Diagnosis not present

## 2022-12-06 DIAGNOSIS — L538 Other specified erythematous conditions: Secondary | ICD-10-CM | POA: Diagnosis not present

## 2022-12-06 DIAGNOSIS — D2371 Other benign neoplasm of skin of right lower limb, including hip: Secondary | ICD-10-CM | POA: Diagnosis not present

## 2022-12-13 ENCOUNTER — Ambulatory Visit: Payer: Medicare Other | Admitting: Family Medicine

## 2022-12-13 ENCOUNTER — Encounter: Payer: Self-pay | Admitting: Family Medicine

## 2022-12-13 VITALS — BP 128/70 | HR 59 | Temp 97.9°F | Ht 66.0 in | Wt 116.6 lb

## 2022-12-13 DIAGNOSIS — I471 Supraventricular tachycardia, unspecified: Secondary | ICD-10-CM

## 2022-12-13 DIAGNOSIS — K219 Gastro-esophageal reflux disease without esophagitis: Secondary | ICD-10-CM | POA: Diagnosis not present

## 2022-12-13 DIAGNOSIS — E782 Mixed hyperlipidemia: Secondary | ICD-10-CM | POA: Diagnosis not present

## 2022-12-13 NOTE — Assessment & Plan Note (Signed)
Stable. Continue propranolol ER 60 mg daily.

## 2022-12-13 NOTE — Progress Notes (Signed)
[facility] [address] [place] [address] Dept: [phone] Dept Fax: [phone]  Chronic Care Office Visit  Subjective:    Patient ID: Kristin Hinton, female    DOB: [birth year], 77 y.o..   MRN: [id number]  Chief Complaint  Patient presents with   Follow-up    6 month f/u.  No concerns.  Fasting today.     History of Present Illness:  Patient is in today for reassessment of chronic medical issues.  Ms. Vanrossum has a history of SVT. She manages this with propranolol ER 60 mg daily. She admits to occasional flutter, but this is not frequent or bothersome.   Ms. Stepanyan has a history of mild hyperlipidemia. Her cardiac CT last year showed a total calcium score of 0.    Ms. Savastano has a history of GERD. This manages well with her Protonix.  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/2020   Lumbar radiculopathy 06/22/2020   Insomnia 05/28/2018   SVT (supraventricular tachycardia) 07/15/2016   LBBB (left bundle branch block) 07/15/2016   Schatzki's ring 07/15/2016   Past Surgical History:  Procedure Laterality Date   HYSTERECTOMY ABDOMINAL WITH SALPINGECTOMY  03/30/1991   LUMBAR DISC SURGERY  03/19/1997   MASTECTOMY Left    Due to surgical complications related to previous breast implant   PLACEMENT OF BREAST IMPLANTS  03/19/2013   TONSILLECTOMY  03/20/1983   Family History  Problem Relation Age of Onset   Congenital heart disease Mother    Congestive Heart Failure Mother    Hyperlipidemia Mother    Hypertension Mother    Kidney  disease Mother    Arthritis Mother    Hearing loss Mother    Cancer Father        Lung   Breast cancer Sister    Cancer Brother        Lung   Alcohol abuse Brother    Drug abuse Brother    Early death Brother    Heart attack Brother    Alcohol abuse Brother    Early death Brother    Hypercholesterolemia Son    Hypertension Son    Heart attack Paternal Aunt    Stroke Maternal Grandmother    Arthritis Maternal Grandmother    Hypercholesterolemia Maternal Grandmother    Hypertension Maternal Grandmother    Arthritis Paternal Grandmother    Heart attack Paternal Grandmother    Hypercholesterolemia Paternal Grandmother    Hypertension Paternal Grandmother    Outpatient Medications Prior to Visit  Medication Sig Dispense Refill   Azelaic Acid-Niacinamide 15-4 % CREA Apply     estradiol (VIVELLE-DOT) 0.075 MG/24HR APPLY 1 PATCH TO SKIN TWICE A WEEK 24 patch 3   fluticasone (FLONASE) 50 MCG/ACT nasal spray USE 2 SPRAYS IN EACH NOSTRIL DAILY 48 g 3   Multiple Vitamin (MULTIVITAMIN) tablet Take 1 tablet by mouth daily.     pantoprazole (PROTONIX) 40 MG tablet TAKE 1 TABLET TWICE A DAY BEFORE MEALS 180 tablet 3   propranolol ER (INDERAL LA) 60 MG 24 hr capsule TAKE 1 CAPSULE DAILY. ( PLEASE SCHEDULE APPOINTMENT FOR FUTURE REFILLS ) 90 capsule  3   triamcinolone cream (KENALOG) 0.1 % Apply 1 application topically 2 (two) times daily. 30 g 0   No facility-administered medications prior to visit.   Allergies  Allergen Reactions   Molds & Smuts Itching   Pollen Extract Itching   Objective:   Today's Vitals   12/13/22 0948  BP: 128/70  Pulse: (!) 59  Temp: 97.9 F (36.6 C)  TempSrc: Temporal  SpO2: 99%  Weight: 116 lb 9.6 oz (52.9 kg)  Height: 5\' 6"  (1.676 m)   Body mass index is 18.82 kg/m.   General: Well developed, well nourished. No acute distress. Psych: Alert and oriented. Normal mood and affect.  There are no preventive care reminders to display for this patient.     Assessment & Plan:   Problem List Items Addressed This Visit       Cardiovascular and Mediastinum   SVT (supraventricular tachycardia) - Primary    Stable. Continue propranolol ER 60 mg daily.        Digestive   GERD (gastroesophageal reflux disease)    Stable. Continue pantoprazole 40 mg daily.        Other   Hyperlipidemia    Lipids are stable. Cardiac calcium score has been zero. Discussed cardiology recommendation to repeat in 5-10 years.        Return in about 6 months (around 06/12/2023) for Reassessment.   Loyola Mast, MD

## 2022-12-13 NOTE — Assessment & Plan Note (Signed)
Stable. Continue pantoprazole 40mg  daily

## 2022-12-13 NOTE — Assessment & Plan Note (Signed)
Lipids are stable. Cardiac calcium score has been zero. Discussed cardiology recommendation to repeat in 5-10 years.

## 2022-12-24 DIAGNOSIS — Z23 Encounter for immunization: Secondary | ICD-10-CM | POA: Diagnosis not present

## 2023-01-01 DIAGNOSIS — Z1231 Encounter for screening mammogram for malignant neoplasm of breast: Secondary | ICD-10-CM | POA: Diagnosis not present

## 2023-01-03 ENCOUNTER — Encounter: Payer: Self-pay | Admitting: Family Medicine

## 2023-01-03 LAB — HM MAMMOGRAPHY

## 2023-02-07 ENCOUNTER — Encounter: Payer: Self-pay | Admitting: Internal Medicine

## 2023-02-07 ENCOUNTER — Ambulatory Visit: Payer: Medicare Other | Admitting: Internal Medicine

## 2023-02-07 VITALS — BP 124/70 | HR 58 | Temp 97.9°F | Ht 66.0 in | Wt 119.0 lb

## 2023-02-07 DIAGNOSIS — H1031 Unspecified acute conjunctivitis, right eye: Secondary | ICD-10-CM | POA: Diagnosis not present

## 2023-02-07 MED ORDER — ERYTHROMYCIN 5 MG/GM OP OINT: TOPICAL_OINTMENT | OPHTHALMIC | 0 refills | Status: DC

## 2023-02-07 NOTE — Patient Instructions (Addendum)
Do warm compresses to eye  Wash hands and wipe down surfaces to avoid spreading infection  Throw away any eye makeup products recently used

## 2023-02-07 NOTE — Progress Notes (Signed)
[facility] [address] [place] [address] Dept: [phone] Dept Fax: [phone]  Acute Care Office Visit  Subjective:   Kristin Hinton [birth year] 02/07/2023  Chief Complaint  Patient presents with   Conjunctivitis    Noticed yesterday  Itching     HPI: Discussed the use of AI scribe software for clinical note transcription with the patient, who gave verbal consent to proceed.  History of Present Illness   The patient presents with several days of right eye itching and burning. Initially, they attributed the symptoms to allergies. However, the symptoms worsened, with the eye becoming increasingly itchy and burning when touched. This morning, they noticed crusting around the eye. They deny any associated fever, sinus congestion, or changes in vision. They have not been around anyone with pink eye and have not introduced any new makeup products. The patient has noticed swelling of the upper eyelid and crusting around the entire eye.       The following portions of the patient's history were reviewed and updated as appropriate: past medical history, past surgical history, family history, social history, allergies, medications, and problem list.   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/25/2020   Lumbar radiculopathy 06/22/2020   Insomnia 05/28/2018   SVT (supraventricular tachycardia) (HCC) 07/15/2016   LBBB (left bundle branch block) 07/15/2016   Schatzki's ring 07/15/2016   Past Medical History:  Diagnosis Date   History of chicken pox    Hyperlipidemia     LBBB (left bundle branch block)    Migraines    Schatzki's ring    SVT (supraventricular tachycardia) (HCC)    Tachycardia    Past Surgical History:  Procedure Laterality Date   HYSTERECTOMY ABDOMINAL WITH SALPINGECTOMY  03/30/1991   LUMBAR DISC SURGERY  03/19/1997   MASTECTOMY Left    Due to surgical complications related to previous breast implant   PLACEMENT OF BREAST IMPLANTS  03/19/2013   TONSILLECTOMY  03/20/1983   Family History  Problem Relation Age of Onset   Congenital heart disease Mother    Congestive Heart Failure Mother    Hyperlipidemia Mother    Hypertension Mother    Kidney disease Mother    Arthritis Mother    Hearing loss Mother    Cancer Father        Lung   Breast cancer Sister    Cancer Brother        Lung   Alcohol abuse Brother    Drug abuse Brother    Early death Brother    Heart attack Brother    Alcohol abuse Brother    Early death Brother    Hypercholesterolemia Son    Hypertension Son    Heart attack Paternal Aunt    Stroke Maternal Grandmother    Arthritis Maternal Grandmother    Hypercholesterolemia Maternal Grandmother    Hypertension Maternal Grandmother    Arthritis Paternal Grandmother    Heart attack Paternal Grandmother    Hypercholesterolemia Paternal Grandmother    Hypertension Paternal Grandmother     Current Outpatient Medications:    Azelaic Acid-Niacinamide 15-4 % CREA, Apply, Disp: , Rfl:    erythromycin  ophthalmic ointment, Instill ~1cm ribbon into affected eye(s) 4 times a day for 7 days, Disp: 3.5 g, Rfl: 0   estradiol (VIVELLE-DOT) 0.075 MG/24HR, APPLY 1 PATCH TO SKIN TWICE A WEEK, Disp: 24 patch, Rfl: 3   fluticasone (FLONASE) 50 MCG/ACT nasal spray, USE 2 SPRAYS IN EACH NOSTRIL DAILY, Disp: 48 g, Rfl: 3   Multiple Vitamin (MULTIVITAMIN) tablet, Take 1 tablet by mouth daily., Disp: , Rfl:    pantoprazole (PROTONIX) 40 MG tablet, TAKE 1 TABLET TWICE A DAY BEFORE MEALS, Disp: 180 tablet, Rfl: 3   propranolol ER  (INDERAL LA) 60 MG 24 hr capsule, TAKE 1 CAPSULE DAILY. ( PLEASE SCHEDULE APPOINTMENT FOR FUTURE REFILLS ), Disp: 90 capsule, Rfl: 3   triamcinolone cream (KENALOG) 0.1 %, Apply 1 application topically 2 (two) times daily., Disp: 30 g, Rfl: 0 Allergies  Allergen Reactions   Molds & Smuts Itching   Pollen Extract Itching     ROS: A complete ROS was performed with pertinent positives/negatives noted in the HPI. The remainder of the ROS are negative.    Objective:   Today's Vitals   02/07/23 1043  BP: 124/70  Pulse: (!) 58  Temp: 97.9 F (36.6 C)  TempSrc: Temporal  SpO2: 99%  Weight: 119 lb (54 kg)  Height: 5\' 6"  (1.676 m)    GENERAL: Well-appearing, in NAD. Well nourished.  SKIN: Pink, warm and dry. No rash, lesion, ulceration, or ecchymoses.  HEENT:    HEAD: Normocephalic, non-traumatic.  EYES: Right conjunctiva injected pink without exudate (removed by patient this morning) Swelling and redness to upper eyelid.  PERRL. NECK: Trachea midline. Full ROM w/o pain or tenderness. No lymphadenopathy.  RESPIRATORY: Chest wall symmetrical. Respirations even and non-labored. Breath sounds clear to auscultation bilaterally.  CARDIAC: S1, S2 present, regular rate and rhythm. Peripheral pulses 2+ bilaterally.  MSK: Muscle tone and strength appropriate for age. Joints w/o tenderness, redness, or swelling. EXTREMITIES: Without clubbing, cyanosis, or edema.  NEUROLOGIC: No motor or sensory deficits. Steady, even gait.  PSYCH/MENTAL STATUS: Alert, oriented x 3. Cooperative, appropriate mood and affect.    No results found for any visits on 02/07/23.    Assessment & Plan:  Assessment and Plan    Conjunctivitis Right eye itching, burning, and crusting suggestive of bacterial conjunctivitis. No associated systemic symptoms or vision changes. No known exposure to individuals with conjunctivitis. -Prescribe Erythromycin ointment, apply to affected eye four times daily for seven  days. -Advise warm compresses multiple times a day. -Advise to discard all eye makeup and brushes to prevent reinfection. -Advise to wash hands frequently and clean surfaces at home to prevent spread. -Advise that condition is contagious until 24 hours after starting antibiotics.      Meds ordered this encounter  Medications   erythromycin ophthalmic ointment    Sig: Instill ~1cm ribbon into affected eye(s) 4 times a day for 7 days    Dispense:  3.5 g    Refill:  0    Order Specific Question:   Supervising Provider    Answer:   Garnette Gunner [[id number]]   No orders of the defined types were placed in this encounter.  Lab Orders  No laboratory test(s) ordered today   No images are attached to the encounter or orders placed in the encounter.  Return if symptoms worsen or fail to improve.   Salvatore Decent, FNP

## 2023-06-12 ENCOUNTER — Ambulatory Visit (INDEPENDENT_AMBULATORY_CARE_PROVIDER_SITE_OTHER): Payer: No Typology Code available for payment source | Admitting: Family Medicine

## 2023-06-12 ENCOUNTER — Encounter: Payer: Self-pay | Admitting: Family Medicine

## 2023-06-12 VITALS — BP 120/68 | Temp 98.0°F | Ht 66.0 in | Wt 115.8 lb

## 2023-06-12 DIAGNOSIS — I471 Supraventricular tachycardia, unspecified: Secondary | ICD-10-CM | POA: Diagnosis not present

## 2023-06-12 DIAGNOSIS — J301 Allergic rhinitis due to pollen: Secondary | ICD-10-CM | POA: Diagnosis not present

## 2023-06-12 DIAGNOSIS — E782 Mixed hyperlipidemia: Secondary | ICD-10-CM

## 2023-06-12 LAB — LIPID PANEL
Cholesterol: 228 mg/dL — ABNORMAL HIGH (ref 0–200)
HDL: 92.8 mg/dL (ref >39.00)
LDL Cholesterol: 118 mg/dL — ABNORMAL HIGH (ref 0–99)
NonHDL: 134.81
Total CHOL/HDL Ratio: 2
Triglycerides: 82 mg/dL (ref 0.0–149.0)
VLDL: 16.4 mg/dL (ref 0.0–40.0)

## 2023-06-12 NOTE — Assessment & Plan Note (Addendum)
 Continue propranolol ER 60 mg daily. She is seeing cardiology soon. She may need a Zio patch to assess increased episodes of palpitations.

## 2023-06-12 NOTE — Assessment & Plan Note (Signed)
 Likely cause of ear symptoms. I recommend she add a daily non-sedating antihistamine to her fluticasone use.

## 2023-06-12 NOTE — Assessment & Plan Note (Signed)
 I will reassess lipids today. Continue therapeutic lifestyle approaches to her cholesterol for now.

## 2023-06-12 NOTE — Progress Notes (Signed)
 [facility] [address] [place] [address] Dept: [phone] Dept Fax: [phone]  Chronic Care Office Visit  Subjective:    Patient ID: Kristin Hinton, female    DOB: 1945-10-26, 78 y.o..   MRN: [id number]  Chief Complaint  Patient presents with   Follow-up    6 month f/u.   Fasting today.  Ear stopped up.     History of Present Illness:  Patient is in today for reassessment of chronic medical issues.  Kristin Hinton has a history of SVT. She manages this with propranolol ER 60 mg daily. She feels like she is having episodes more frequently. She cannot identify an underlying cause for this. She denies increased caffeine intake or use of decongestants. She has an upcoming appointment with cardiology.   Kristin Hinton  notes her allergies are flaring some. She is having some nasal congestion and rhinorrhea. She also notes some left ear symptoms with a roaring sound and muffled hearing. She admits to feeling some pressure in the ear.  Kristin Hinton has a history of hyperlipidemia. Her coronary artery calcium score in 2023 was 0.   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/2020   Lumbar radiculopathy 06/22/2020   Insomnia 05/28/2018   SVT (supraventricular tachycardia) (HCC) 07/15/2016   LBBB (left bundle branch block) 07/15/2016   Schatzki's ring 07/15/2016   Past Surgical History:  Procedure Laterality Date   HYSTERECTOMY ABDOMINAL WITH SALPINGECTOMY  03/30/1991   LUMBAR DISC SURGERY  03/19/1997   MASTECTOMY Left    Due to surgical  complications related to previous breast implant   PLACEMENT OF BREAST IMPLANTS  03/19/2013   TONSILLECTOMY  03/20/1983   Family History  Problem Relation Age of Onset   Congenital heart disease Mother    Congestive Heart Failure Mother    Hyperlipidemia Mother    Hypertension Mother    Kidney disease Mother    Arthritis Mother    Hearing loss Mother    Cancer Father        Lung   Breast cancer Sister    Cancer Brother        Lung   Alcohol abuse Brother    Drug abuse Brother    Early death Brother    Heart attack Brother    Alcohol abuse Brother    Early death Brother    Hypercholesterolemia Son    Hypertension Son    Heart attack Paternal Aunt    Stroke Maternal Grandmother    Arthritis Maternal Grandmother    Hypercholesterolemia Maternal Grandmother    Hypertension Maternal Grandmother    Arthritis Paternal Grandmother    Heart attack Paternal Grandmother    Hypercholesterolemia Paternal Grandmother    Hypertension Paternal Grandmother    Outpatient Medications Prior to Visit  Medication Sig Dispense Refill   Azelaic Acid-Niacinamide 15-4 % CREA Apply     estradiol (VIVELLE-DOT) 0.075 MG/24HR APPLY 1 PATCH TO SKIN TWICE A WEEK 24 patch 3   fluticasone (FLONASE) 50 MCG/ACT nasal spray USE 2 SPRAYS IN EACH NOSTRIL DAILY 48 g 3   Multiple Vitamin (MULTIVITAMIN) tablet Take 1 tablet by mouth  daily.     pantoprazole (PROTONIX) 40 MG tablet TAKE 1 TABLET TWICE A DAY BEFORE MEALS 180 tablet 3   propranolol ER (INDERAL LA) 60 MG 24 hr capsule TAKE 1 CAPSULE DAILY. ( PLEASE SCHEDULE APPOINTMENT FOR FUTURE REFILLS ) 90 capsule 3   triamcinolone cream (KENALOG) 0.1 % Apply 1 application topically 2 (two) times daily. 30 g 0   erythromycin ophthalmic ointment Instill ~1cm ribbon into affected eye(s) 4 times a day for 7 days 3.5 g 0   No facility-administered medications prior to visit.   Allergies  Allergen Reactions   Molds & Smuts Itching   Pollen Extract Itching    Objective:   Today's Vitals   06/12/23 0914  BP: 120/68  Temp: 98 F (36.7 C)  TempSrc: Temporal  SpO2: 100%  Weight: 115 lb 12.8 oz (52.5 kg)  Height: 5\' 6"  (1.676 m)   Body mass index is 18.69 kg/m.   General: Well developed, well nourished. No acute distress. HEENT: Normocephalic, non-traumatic. External ears normal. EAC and TMs normal bilaterally.  Psych: Alert and oriented. Normal mood and affect.  There are no preventive care reminders to display for this patient.    Assessment & Plan:   Problem List Items Addressed This Visit       Cardiovascular and Mediastinum   SVT (supraventricular tachycardia) (HCC) - Primary   Continue propranolol ER 60 mg daily. She is seeing cardiology soon. She may need a Zio patch to assess increased episodes of palpitations.        Respiratory   Allergic rhinitis   Likely cause of ear symptoms. I recommend she add a daily non-sedating antihistamine to her fluticasone use.        Other   Hyperlipidemia   I will reassess lipids today. Continue therapeutic lifestyle approaches to her cholesterol for now.      Relevant Orders   Lipid panel    Return in about 6 months (around 12/13/2023) for Reassessment.   Loyola Mast, MD

## 2023-06-21 DIAGNOSIS — L821 Other seborrheic keratosis: Secondary | ICD-10-CM | POA: Diagnosis not present

## 2023-06-21 DIAGNOSIS — L814 Other melanin hyperpigmentation: Secondary | ICD-10-CM | POA: Diagnosis not present

## 2023-06-21 DIAGNOSIS — D225 Melanocytic nevi of trunk: Secondary | ICD-10-CM | POA: Diagnosis not present

## 2023-07-02 ENCOUNTER — Encounter: Payer: Self-pay | Admitting: Family Medicine

## 2023-07-02 DIAGNOSIS — H6990 Unspecified Eustachian tube disorder, unspecified ear: Secondary | ICD-10-CM

## 2023-07-08 NOTE — Progress Notes (Signed)
  Electrophysiology Office Note:   Date:  07/09/2023  ID:  Kristin Hinton, DOB [birth year], MRN [id number]  Primary Cardiologist: None Electrophysiologist: Richardo Chandler, MD      History of Present Illness:   Kristin Hinton is a 78 y.o. female with h/o HLD, Schatzki's ring, LBBB, and SVT seen today for routine electrophysiology followup.   Since last being seen in our clinic the patient reports doing well overall. She is on a behind the counter allergy medication and has noted increase in palpitations. Discussed decongestant use today. Otherwise, she denies chest pain, dyspnea, PND, orthopnea, nausea, vomiting, dizziness, syncope, edema, weight gain, or early satiety.   Review of systems complete and found to be negative unless listed in HPI.   EP Information / Studies Reviewed:    EKG is ordered today. Personal review as below.  EKG Interpretation Date/Time:  Tuesday July 09 2023 10:30:59 EDT Ventricular Rate:  60 PR Interval:  164 QRS Duration:  106 QT Interval:  430 QTC Calculation: 430 R Axis:   76  Text Interpretation: Normal sinus rhythm Cannot rule out Anterior infarct , age undetermined No previous ECGs available Confirmed by Pilar Bridge [phone]) on 07/09/2023 10:41:36 AM    Arrhythmia/Device History No specialty comments available.   Physical Exam:   VS:  BP 120/68   Pulse 60   Ht 5' 6.5" (1.689 m)   Wt 116 lb 3.2 oz (52.7 kg)   SpO2 98%   BMI 18.47 kg/m    Wt Readings from Last 3 Encounters:  07/09/23 116 lb 3.2 oz (52.7 kg)  06/12/23 115 lb 12.8 oz (52.5 kg)  02/07/23 119 lb (54 kg)     GEN: No acute distress NECK: No JVD; No carotid bruits CARDIAC: Regular rate and rhythm, no murmurs, rubs, gallops RESPIRATORY:  Clear to auscultation without rales, wheezing or rhonchi  ABDOMEN: Soft, non-tender, non-distended EXTREMITIES:  No edema; No deformity   ASSESSMENT AND PLAN:    Palpitations SVT PACs EKG today shows NSR Symptoms overall well controlled  on propranolol  60 mg daily  Recently exacerbated in setting of decongestant use. Encouraged non-decongestant seasonal allergy meds including claritin or zyrtec. Mucinex and flonase  also reasonable options.   Follow up with EP APP in 12 months  Signed, Tylene Galla, PA-C

## 2023-07-09 ENCOUNTER — Ambulatory Visit: Attending: Student | Admitting: Student

## 2023-07-09 ENCOUNTER — Ambulatory Visit: Payer: No Typology Code available for payment source | Admitting: Physician Assistant

## 2023-07-09 ENCOUNTER — Encounter: Payer: Self-pay | Admitting: Student

## 2023-07-09 VITALS — BP 120/68 | HR 60 | Ht 66.5 in | Wt 116.2 lb

## 2023-07-09 DIAGNOSIS — I491 Atrial premature depolarization: Secondary | ICD-10-CM | POA: Diagnosis not present

## 2023-07-09 DIAGNOSIS — R002 Palpitations: Secondary | ICD-10-CM | POA: Diagnosis not present

## 2023-07-09 DIAGNOSIS — I471 Supraventricular tachycardia, unspecified: Secondary | ICD-10-CM | POA: Diagnosis not present

## 2023-07-09 MED ORDER — PROPRANOLOL HCL ER 60 MG PO CP24: 60.0000 mg | ORAL_CAPSULE | Freq: Every day | ORAL | 3 refills | Status: AC

## 2023-07-09 NOTE — Patient Instructions (Signed)
 Medication Instructions:  Your physician recommends that you continue on your current medications as directed. Please refer to the Current Medication list given to you today.  *If you need a refill on your cardiac medications before your next appointment, please call your pharmacy*  Lab Work: None ordered If you have labs (blood work) drawn today and your tests are completely normal, you will receive your results only by: MyChart Message (if you have MyChart) OR A paper copy in the mail If you have any lab test that is abnormal or we need to change your treatment, we will call you to review the results.  Follow-Up: At [facility], you and your health needs are our priority.  As part of our continuing mission to provide you with exceptional heart care, our providers are all part of one team.  This team includes your primary Cardiologist (physician) and Advanced Practice Providers or APPs (Physician Assistants and Nurse Practitioners) who all work together to provide you with the care you need, when you need it.  Your next appointment:   1 year(s)  Provider:   Casimiro Needle "Mardelle Matte" Lanna Poche, PA-C       1st Floor: - Lobby - Registration  - Pharmacy  - Lab - Cafe  2nd Floor: - PV Lab - Diagnostic Testing (echo, CT, nuclear med)  3rd Floor: - Vacant  4th Floor: - TCTS (cardiothoracic surgery) - AFib Clinic - Structural Heart Clinic - Vascular Surgery  - Vascular Ultrasound  5th Floor: - HeartCare Cardiology (general and EP) - Clinical Pharmacy for coumadin, hypertension, lipid, weight-loss medications, and med management appointments    Valet parking services will be available as well.

## 2023-07-26 ENCOUNTER — Other Ambulatory Visit: Payer: Self-pay | Admitting: Family Medicine

## 2023-07-26 DIAGNOSIS — N951 Menopausal and female climacteric states: Secondary | ICD-10-CM

## 2023-08-20 DIAGNOSIS — H903 Sensorineural hearing loss, bilateral: Secondary | ICD-10-CM | POA: Diagnosis not present

## 2023-08-20 DIAGNOSIS — H9312 Tinnitus, left ear: Secondary | ICD-10-CM | POA: Insufficient documentation

## 2023-08-28 ENCOUNTER — Other Ambulatory Visit: Payer: Self-pay | Admitting: Family Medicine

## 2023-08-28 DIAGNOSIS — K222 Esophageal obstruction: Secondary | ICD-10-CM

## 2023-09-02 DIAGNOSIS — H903 Sensorineural hearing loss, bilateral: Secondary | ICD-10-CM | POA: Diagnosis not present

## 2023-09-17 ENCOUNTER — Institutional Professional Consult (permissible substitution) (INDEPENDENT_AMBULATORY_CARE_PROVIDER_SITE_OTHER): Admitting: Otolaryngology

## 2023-09-17 ENCOUNTER — Ambulatory Visit (INDEPENDENT_AMBULATORY_CARE_PROVIDER_SITE_OTHER): Admitting: Audiology

## 2023-10-16 DIAGNOSIS — H903 Sensorineural hearing loss, bilateral: Secondary | ICD-10-CM | POA: Diagnosis not present

## 2023-10-22 DIAGNOSIS — H25013 Cortical age-related cataract, bilateral: Secondary | ICD-10-CM | POA: Diagnosis not present

## 2023-10-22 DIAGNOSIS — H2513 Age-related nuclear cataract, bilateral: Secondary | ICD-10-CM | POA: Diagnosis not present

## 2023-11-06 ENCOUNTER — Other Ambulatory Visit: Payer: Self-pay | Admitting: Family Medicine

## 2023-11-11 ENCOUNTER — Ambulatory Visit (INDEPENDENT_AMBULATORY_CARE_PROVIDER_SITE_OTHER): Payer: No Typology Code available for payment source

## 2023-11-11 DIAGNOSIS — Z0000 Encounter for general adult medical examination without abnormal findings: Secondary | ICD-10-CM | POA: Diagnosis not present

## 2023-11-11 NOTE — Patient Instructions (Signed)
 Kristin Hinton , Thank you for taking time out of your busy schedule to complete your Annual Wellness Visit with me. I enjoyed our conversation and look forward to speaking with you again next year. I, as well as your care team,  appreciate your ongoing commitment to your health goals. Please review the following plan we discussed and let me know if I can assist you in the future. Your Game plan/ To Do List    Referrals: If you haven't heard from the office you've been referred to, please reach out to them at the phone provided.   Follow up Visits: We will see or speak with you next year for your Next Medicare AWV with our clinical staff Have you seen your provider in the last 6 months (3 months if uncontrolled diabetes)? Yes  Clinician Recommendations:  Aim for 30 minutes of exercise or brisk walking, 6-8 glasses of water, and 5 servings of fruits and vegetables each day.       This is a list of the screenings recommended for you:  Health Maintenance  Topic Date Due   COVID-19 Vaccine (6 - 2024-25 season) 11/18/2022   Flu Shot  10/18/2023   Medicare Annual Wellness Visit  11/10/2024   DTaP/Tdap/Td vaccine (3 - Td or Tdap) 01/18/2031   Pneumococcal Vaccine for age over 38  Completed   DEXA scan (bone density measurement)  Completed   Hepatitis C Screening  Completed   Zoster (Shingles) Vaccine  Completed   HPV Vaccine  Aged Out   Meningitis B Vaccine  Aged Out   Colon Cancer Screening  Discontinued    Advanced directives: (In Chart) A copy of your advanced directives are scanned into your chart should your provider ever need it. Advance Care Planning is important because it:  [x]  Makes sure you receive the medical care that is consistent with your values, goals, and preferences  [x]  It provides guidance to your family and loved ones and reduces their decisional burden about whether or not they are making the right decisions based on your wishes.  Follow the link provided in your  after visit summary or read over the paperwork we have mailed to you to help you started getting your Advance Directives in place. If you need assistance in completing these, please reach out to us  so that we can help you!  See attachments for Preventive Care and Fall Prevention Tips.

## 2023-11-11 NOTE — Progress Notes (Signed)
 Subjective:   Kristin Hinton is a 78 y.o. who presents for a Medicare Wellness preventive visit.  As a reminder, Annual Wellness Visits don't include a physical exam, and some assessments may be limited, especially if this visit is performed virtually. We may recommend an in-person follow-up visit with your provider if needed.  Visit Complete: Virtual I connected with  Romero Flake on 11/11/23 by a audio enabled telemedicine application and verified that I am speaking with the correct person using two identifiers.  Patient Location: Home  Provider Location: Office/Clinic  I discussed the limitations of evaluation and management by telemedicine. The patient expressed understanding and agreed to proceed.  Vital Signs: Because this visit was a virtual/telehealth visit, some criteria may be missing or patient reported. Any vitals not documented were not able to be obtained and vitals that have been documented are patient reported.  VideoError- Librarian, academic were attempted between this provider and patient, however failed, due to patient having technical difficulties OR patient did not have access to video capability.  We continued and completed visit with audio only.   Persons Participating in Visit: Patient.  AWV Questionnaire: Yes: Patient Medicare AWV questionnaire was completed by the patient on 11/10/2023; I have confirmed that all information answered by patient is correct and no changes since this date.  Cardiac Risk Factors include: advanced age (>64men, >61 women);dyslipidemia     Objective:    Today's Vitals   There is no height or weight on file to calculate BMI.     11/11/2023    1:00 PM 11/05/2022    1:02 PM 10/25/2021    9:42 AM 10/18/2020   11:29 AM 09/09/2019   11:20 AM 09/03/2018   10:05 AM  Advanced Directives  Does Patient Have a Medical Advance Directive? Yes Yes Yes Yes Yes Yes  Type of Estate agent of  [place];Living will Healthcare Power of [place];Living will Healthcare Power of [place];Living will Healthcare Power of [place];Living will Healthcare Power of [place];Living will Living will  Does patient want to make changes to medical advance directive?      No - Patient declined   Copy of Healthcare Power of Attorney in Chart? Yes - validated most recent copy scanned in chart (See row information) Yes - validated most recent copy scanned in chart (See row information) No - copy requested Yes - validated most recent copy scanned in chart (See row information) No - copy requested      Data saved with a previous flowsheet row definition    Current Medications (verified) Outpatient Encounter Medications as of 11/11/2023  Medication Sig   Azelaic Acid-Niacinamide 15-4 % CREA Apply   estradiol  (VIVELLE -DOT) 0.075 MG/24HR APPLY 1 PATCH TO SKIN TWICE A WEEK   fluticasone  (FLONASE ) 50 MCG/ACT nasal spray USE 2 SPRAYS IN EACH NOSTRIL DAILY   Multiple Vitamin (MULTIVITAMIN) tablet Take 1 tablet by mouth daily.   pantoprazole  (PROTONIX ) 40 MG tablet TAKE 1 TABLET TWICE A DAY BEFORE MEALS   propranolol  ER (INDERAL  LA) 60 MG 24 hr capsule Take 1 capsule (60 mg total) by mouth daily.   triamcinolone  cream (KENALOG ) 0.1 % Apply 1 application topically 2 (two) times daily.   valACYclovir (VALTREX) 500 MG tablet Take 500 mg by mouth 2 (two) times daily.   No facility-administered encounter medications on file as of 11/11/2023.    Allergies (verified) Molds & smuts and Pollen extract   History: Past Medical History:  Diagnosis Date   History  of chicken pox    Hyperlipidemia    LBBB (left bundle branch block)    Migraines    Schatzki's ring    SVT (supraventricular tachycardia) (HCC)    Tachycardia    Past Surgical History:  Procedure Laterality Date   HYSTERECTOMY ABDOMINAL WITH SALPINGECTOMY  03/30/1991   LUMBAR DISC SURGERY  03/19/1997   MASTECTOMY Left    Due to surgical complications  related to previous breast implant   PLACEMENT OF BREAST IMPLANTS  03/19/2013   TONSILLECTOMY  03/20/1983   Family History  Problem Relation Age of Onset   Congenital heart disease Mother    Congestive Heart Failure Mother    Hyperlipidemia Mother    Hypertension Mother    Kidney disease Mother    Arthritis Mother    Hearing loss Mother    Cancer Father        Lung   Breast cancer Sister    Cancer Brother        Lung   Alcohol abuse Brother    Drug abuse Brother    Early death Brother    Heart attack Brother    Alcohol abuse Brother    Early death Brother    Hypercholesterolemia Son    Hypertension Son    Heart attack Paternal Aunt    Stroke Maternal Grandmother    Arthritis Maternal Grandmother    Hypercholesterolemia Maternal Grandmother    Hypertension Maternal Grandmother    Arthritis Paternal Grandmother    Heart attack Paternal Grandmother    Hypercholesterolemia Paternal Grandmother    Hypertension Paternal Grandmother    Social History   Socioeconomic History   Marital status: Married    Spouse name: Not on file   Number of children: 2   Years of education: Not on file   Highest education level: Some college, no degree  Occupational History   Occupation: Retired  Tobacco Use   Smoking status: Never   Smokeless tobacco: Never  Vaping Use   Vaping status: Never Used  Substance and Sexual Activity   Alcohol use: Yes    Comment: 7-10 drinks per week   Drug use: Never   Sexual activity: Yes  Other Topics Concern   Not on file  Social History Narrative   Not on file   Social Drivers of Health   Financial Resource Strain: Low Risk  (11/10/2023)   Overall Financial Resource Strain (CARDIA)    Difficulty of Paying Living Expenses: Not hard at all  Food Insecurity: No Food Insecurity (11/10/2023)   Hunger Vital Sign    Worried About Running Out of Food in the Last Year: Never true    Ran Out of Food in the Last Year: Never true  Transportation Needs:  No Transportation Needs (11/10/2023)   PRAPARE - Administrator, Civil Service (Medical): No    Lack of Transportation (Non-Medical): No  Physical Activity: Sufficiently Active (11/10/2023)   Exercise Vital Sign    Days of Exercise per Week: 5 days    Minutes of Exercise per Session: 30 min  Stress: No Stress Concern Present (11/10/2023)   [employer] of Occupational Health - Occupational Stress Questionnaire    Feeling of Stress: Only a little  Social Connections: Moderately Isolated (11/10/2023)   Social Connection and Isolation Panel    Frequency of Communication with Friends and Family: More than three times a week    Frequency of Social Gatherings with Friends and Family: Twice a week    Attends  Religious Services: Never    Active Member of Clubs or Organizations: No    Attends Engineer, structural: Not on file    Marital Status: Married    Tobacco Counseling Counseling given: Not Answered    Clinical Intake:  Pre-visit preparation completed: Yes  Pain : No/denies pain     Nutritional Risks: None Diabetes: No  No results found for: HGBA1C   How often do you need to have someone help you when you read instructions, pamphlets, or other written materials from your doctor or pharmacy?: 1 - Never  Interpreter Needed?: No  Information entered by :: NAllen LPN   Activities of Daily Living     11/10/2023    2:19 PM  In your present state of health, do you have any difficulty performing the following activities:  Hearing? 0  Vision? 0  Difficulty concentrating or making decisions? 0  Walking or climbing stairs? 0  Dressing or bathing? 0  Doing errands, shopping? 0  Preparing Food and eating ? N  Using the Toilet? N  In the past six months, have you accidently leaked urine? N  Do you have problems with loss of bowel control? N  Managing your Medications? N  Managing your Finances? N  Housekeeping or managing your Housekeeping? N     Patient Care Team: Thedora Garnette HERO, MD as PCP - General (Family Medicine) Fernande Elspeth BROCKS, MD as PCP - Electrophysiology (Cardiology) Chick Venetia BRAVO, MD as Consulting Physician (Family Medicine) Elnor Rome BROCKS, MD as Referring Physician (Dermatology) Octavia Bruckner, MD as Consulting Physician (Ophthalmology)  I have updated your Care Teams any recent Medical Services you may have received from other providers in the past year.     Assessment:   This is a routine wellness examination for Malloree.  Hearing/Vision screen Hearing Screening - Comments:: Denies hearing issues Vision Screening - Comments:: Regular eye exams, Dr. Francella   Goals Addressed             This Visit's Progress    Patient Stated       11/11/2023, stay healthy and active       Depression Screen     11/11/2023    1:01 PM 02/07/2023   10:43 AM 11/05/2022    1:03 PM 06/12/2022    9:59 AM 10/25/2021    9:42 AM 10/25/2021    9:40 AM 06/08/2021    8:53 AM  PHQ 2/9 Scores  PHQ - 2 Score 0 0 0 0 0 0 0  PHQ- 9 Score 0  4        Fall Risk     11/10/2023    2:19 PM 02/07/2023   10:43 AM 11/05/2022    1:03 PM 06/12/2022    9:59 AM 10/25/2021    9:42 AM  Fall Risk   Falls in the past year? 0 0 0 0 0  Number falls in past yr: 0 0 0 0 0  Injury with Fall? 0 0 0 0 0  Risk for fall due to : Medication side effect No Fall Risks Medication side effect No Fall Risks   Follow up Falls prevention discussed;Falls evaluation completed Falls prevention discussed Falls prevention discussed Falls evaluation completed Falls evaluation completed;Education provided      Data saved with a previous flowsheet row definition    MEDICARE RISK AT HOME:  Medicare Risk at Home Any stairs in or around the home?: (Patient-Rptd) Yes If so, are there any without handrails?: (Patient-Rptd) No  Home free of loose throw rugs in walkways, pet beds, electrical cords, etc?: (Patient-Rptd) Yes Adequate lighting in your home to reduce  risk of falls?: (Patient-Rptd) Yes Life alert?: (Patient-Rptd) No Use of a cane, walker or w/c?: (Patient-Rptd) No Grab bars in the bathroom?: (Patient-Rptd) No Shower chair or bench in shower?: (Patient-Rptd) Yes Elevated toilet seat or a handicapped toilet?: (Patient-Rptd) Yes  TIMED UP AND GO:  Was the test performed?  No  Cognitive Function: 6CIT completed        11/11/2023    1:02 PM 11/05/2022    1:05 PM 09/09/2019   11:26 AM  6CIT Screen  What Year? 0 points 0 points 0 points  What month? 0 points 0 points 0 points  What time? 0 points 0 points 0 points  Count back from 20 0 points 0 points 0 points  Months in reverse 0 points 0 points 0 points  Repeat phrase 0 points 0 points 0 points  Total Score 0 points 0 points 0 points    Immunizations Immunization History  Administered Date(s) Administered   Fluad Quad(high Dose 65+) 12/24/2018, 12/09/2020, 12/12/2021   Fluad Trivalent(High Dose 65+) 12/24/2022   H1N1 01/22/2008   INFLUENZA, HIGH DOSE SEASONAL PF 02/16/2005, 02/21/2006, 01/09/2007, 11/27/2007, 01/22/2008, 11/15/2008, 11/09/2009, 12/30/2010, 12/22/2012, 12/23/2013, 01/07/2015, 12/19/2015, 12/25/2016, 12/05/2017   Influenza-Unspecified 12/17/2019   PFIZER(Purple Top)SARS-COV-2 Vaccination 05/12/2019, 06/02/2019, 01/25/2020, 08/09/2020   Pfizer Covid-19 Vaccine Bivalent Booster 53yrs & up 01/26/2021   Pneumococcal Conjugate-13 04/27/2013   Pneumococcal Polysaccharide-23 06/25/2011   Tdap 11/09/2009, 01/17/2021   Zoster Recombinant(Shingrix) 10/29/2016, 01/30/2017   Zoster, Live 10/22/2007    Screening Tests Health Maintenance  Topic Date Due   COVID-19 Vaccine (6 - 2024-25 season) 11/18/2022   INFLUENZA VACCINE  10/18/2023   Medicare Annual Wellness (AWV)  11/10/2024   DTaP/Tdap/Td (3 - Td or Tdap) 01/18/2031   Pneumococcal Vaccine: 50+ Years  Completed   DEXA SCAN  Completed   Hepatitis C Screening  Completed   Zoster Vaccines- Shingrix  Completed    HPV VACCINES  Aged Out   Meningococcal B Vaccine  Aged Out   Colonoscopy  Discontinued    Health Maintenance  Health Maintenance Due  Topic Date Due   COVID-19 Vaccine (6 - 2024-25 season) 11/18/2022   INFLUENZA VACCINE  10/18/2023   Health Maintenance Items Addressed: Due for flu and covid vaccine.  Additional Screening:  Vision Screening: Recommended annual ophthalmology exams for early detection of glaucoma and other disorders of the eye. Would you like a referral to an eye doctor? No    Dental Screening: Recommended annual dental exams for proper oral hygiene  Community Resource Referral / Chronic Care Management: CRR required this visit?  No   CCM required this visit?  No   Plan:    I have personally reviewed and noted the following in the patient's chart:   Medical and social history Use of alcohol, tobacco or illicit drugs  Current medications and supplements including opioid prescriptions. Patient is not currently taking opioid prescriptions. Functional ability and status Nutritional status Physical activity Advanced directives List of other physicians Hospitalizations, surgeries, and ER visits in previous 12 months Vitals Screenings to include cognitive, depression, and falls Referrals and appointments  In addition, I have reviewed and discussed with patient certain preventive protocols, quality metrics, and best practice recommendations. A written personalized care plan for preventive services as well as general preventive health recommendations were provided to patient.   Ardella FORBES Dawn, LPN  11/11/2023   After Visit Summary: (MyChart) Due to this being a telephonic visit, the after visit summary with patients personalized plan was offered to patient via MyChart   Notes: Nothing significant to report at this time.

## 2023-12-13 ENCOUNTER — Ambulatory Visit: Admitting: Family Medicine

## 2023-12-20 ENCOUNTER — Ambulatory Visit (INDEPENDENT_AMBULATORY_CARE_PROVIDER_SITE_OTHER): Admitting: Family Medicine

## 2023-12-20 ENCOUNTER — Encounter: Payer: Self-pay | Admitting: Family Medicine

## 2023-12-20 VITALS — BP 124/68 | HR 63 | Temp 97.5°F | Ht 66.5 in | Wt 116.2 lb

## 2023-12-20 DIAGNOSIS — H903 Sensorineural hearing loss, bilateral: Secondary | ICD-10-CM

## 2023-12-20 DIAGNOSIS — I471 Supraventricular tachycardia, unspecified: Secondary | ICD-10-CM

## 2023-12-20 DIAGNOSIS — K319 Disease of stomach and duodenum, unspecified: Secondary | ICD-10-CM | POA: Diagnosis not present

## 2023-12-20 DIAGNOSIS — R1013 Epigastric pain: Secondary | ICD-10-CM | POA: Diagnosis not present

## 2023-12-20 DIAGNOSIS — Z23 Encounter for immunization: Secondary | ICD-10-CM

## 2023-12-20 NOTE — Assessment & Plan Note (Signed)
 Discussed her issues with tolerating her hearing aides. We discussed that she can decide to use them or not for there time being. She may hold off on daily wear and use these more if her hearing worsens.

## 2023-12-20 NOTE — Assessment & Plan Note (Signed)
Stable. Continue propranolol ER 60 mg daily.

## 2023-12-20 NOTE — Progress Notes (Signed)
 [facility] [address] [place] [address] Dept: [phone] Dept Fax: [phone]  Chronic Care Office Visit  Subjective:    Patient ID: Kristin Hinton, female    DOB: Nov 16, 1945, 78 y.o..   MRN: [id number]  Chief Complaint  Patient presents with   Follow-up    6 month f/u.  No concerns.  Flu shot today.  Questions about getting covid & RSV shots.    History of Present Illness:  Patient is in today for reassessment of chronic medical issues.  Kristin Hinton has a history of SVT. She manages this with propranolol  ER 60 mg daily. She notes this has been doing well over the past several months.  Kristin Hinton was seen by audiology due to tinnitus. 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 She now has hearing aides. She is not adjusting to these very well, noting it feels like they are too loud. She also feels they may be worsenign her tinnitus rather than improving this.  Kristin Hinton has some recurrent Gi distress with certain foods. As she has some nasal allergy issues, she wonders if she might have some food allergies at play. She asks about specific testing in this regard.    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   GERD (gastroesophageal reflux disease) 12/12/2021   Hyperlipidemia 06/13/2021   Osteoarthritis of fingers of hands, bilateral 12/09/2020   Allergic rhinitis 12/09/2020   Symptomatic menopausal or female climacteric states 12/09/2020   Acne 12/09/2020   Rosacea 12/09/2020   Labral tear of hip, degenerative 08/25/2020   Lumbar radiculopathy 06/22/2020   Insomnia 05/28/2018   SVT (supraventricular tachycardia) 07/15/2016   LBBB (left bundle branch block) 07/15/2016   Schatzki's ring 07/15/2016   Past Surgical History:  Procedure Laterality Date    HYSTERECTOMY ABDOMINAL WITH SALPINGECTOMY  03/30/1991   LUMBAR DISC SURGERY  03/19/1997   MASTECTOMY Left    Due to surgical complications related to previous breast implant   PLACEMENT OF BREAST IMPLANTS  03/19/2013   TONSILLECTOMY  03/20/1983   Family History  Problem Relation Age of Onset   Congenital heart disease Mother    Congestive Heart Failure Mother    Hyperlipidemia Mother    Hypertension Mother    Kidney disease Mother    Arthritis Mother    Hearing loss Mother    Cancer Father        Lung   Breast cancer Sister    Cancer Brother        Lung   Alcohol abuse Brother    Drug abuse Brother    Early death Brother    Heart attack Brother    Alcohol abuse Brother    Early death Brother    Hypercholesterolemia Son    Hypertension Son    Heart attack Paternal Aunt    Stroke Maternal Grandmother    Arthritis Maternal Grandmother    Hypercholesterolemia Maternal Grandmother    Hypertension Maternal Grandmother    Arthritis Paternal Grandmother    Heart attack Paternal Grandmother    Hypercholesterolemia Paternal Grandmother    Hypertension Paternal Grandmother    Outpatient Medications Prior to Visit  Medication Sig Dispense Refill   Azelaic Acid-Niacinamide 15-4 % CREA Apply     estradiol  (VIVELLE -DOT) 0.075 MG/24HR APPLY 1 PATCH TO SKIN TWICE A WEEK 24 patch 3  fluticasone  (FLONASE ) 50 MCG/ACT nasal spray USE 2 SPRAYS IN EACH NOSTRIL DAILY 48 g 3   Multiple Vitamin (MULTIVITAMIN) tablet Take 1 tablet by mouth daily.     pantoprazole  (PROTONIX ) 40 MG tablet TAKE 1 TABLET TWICE A DAY BEFORE MEALS 180 tablet 3   propranolol  ER (INDERAL  LA) 60 MG 24 hr capsule Take 1 capsule (60 mg total) by mouth daily. 90 capsule 3   triamcinolone  cream (KENALOG ) 0.1 % Apply 1 application topically 2 (two) times daily. 30 g 0   valACYclovir (VALTREX) 500 MG tablet Take 500 mg by mouth 2 (two) times daily.     No facility-administered medications prior to visit.   Allergies   Allergen Reactions   Molds & Smuts Itching   Pollen Extract Itching   Objective:   Today's Vitals   12/20/23 1253  BP: 124/68  Pulse: 63  Temp: (!) 97.5 F (36.4 C)  TempSrc: Temporal  SpO2: 99%  Weight: 116 lb 3.2 oz (52.7 kg)  Height: 5' 6.5 (1.689 m)   Body mass index is 18.47 kg/m.   General: Well developed, well nourished. No acute distress. Psych: Alert and oriented. Normal mood and affect.  There are no preventive care reminders to display for this patient.    Assessment & Plan:   Problem List Items Addressed This Visit       Cardiovascular and Mediastinum   SVT (supraventricular tachycardia) - Primary   Stable. Continue propranolol  ER 60 mg daily.         Nervous and Auditory   Asymmetrical sensorineural hearing loss   Discussed her issues with tolerating her hearing aides. We discussed that she can decide to use them or not for there time being. She may hold off on daily wear and use these more if her hearing worsens.      Other Visit Diagnoses       Dyspepsia and disorder of function of stomach       I will order a food allergy panel.   Relevant Orders   Allergen food profile specific IgE     Need for immunization against influenza       Relevant Orders   Flu vaccine HIGH DOSE PF(Fluzone Trivalent) (Completed)       Return in about 6 months (around 06/19/2024) for Reassessment.   Kristin CHRISTELLA Simpler, MD

## 2023-12-24 ENCOUNTER — Ambulatory Visit: Payer: Self-pay | Admitting: Family Medicine

## 2023-12-24 LAB — ALLERGEN FOOD PROFILE SPECIFIC IGE
Allergen Apple, IgE: <0.1 kU/L
Allergen Corn, IgE: <0.1 kU/L
Allergen Tomato, IgE: <0.1 kU/L
Chicken IgE: <0.1 kU/L
Codfish IgE: <0.1 kU/L
Egg White IgE: <0.1 kU/L
IgE (Immunoglobulin E), Serum: 11 [IU]/mL (ref 6–495)
Milk IgE: <0.1 kU/L
Orange: <0.1 kU/L
Peanut IgE: <0.1 kU/L
Shrimp IgE: <0.1 kU/L
Soybean IgE: <0.1 kU/L
Tuna: <0.1 kU/L
Wheat IgE: <0.1 kU/L

## 2024-01-03 DIAGNOSIS — Z1231 Encounter for screening mammogram for malignant neoplasm of breast: Secondary | ICD-10-CM | POA: Diagnosis not present

## 2024-01-03 LAB — HM MAMMOGRAPHY

## 2024-01-15 ENCOUNTER — Encounter: Payer: Self-pay | Admitting: Family Medicine

## 2024-06-22 ENCOUNTER — Ambulatory Visit: Admitting: Family Medicine

## 2024-11-16 ENCOUNTER — Ambulatory Visit
